# Patient Record
Sex: Male | Born: 1970 | Race: White | Hispanic: No | Smoking: Current every day smoker
Health system: Southern US, Community
[De-identification: ages and names within clinical notes are randomized; demographics above are authoritative.]

## PROBLEM LIST (undated history)

## (undated) HISTORY — PX: KNEE SURGERY: SHX244

## (undated) HISTORY — PX: APPENDECTOMY: SHX54

---

## 2003-06-07 ENCOUNTER — Ambulatory Visit (HOSPITAL_COMMUNITY): Admission: RE | Admit: 2003-06-07 | Discharge: 2003-06-07 | Payer: Self-pay | Admitting: Orthopedic Surgery

## 2003-07-18 ENCOUNTER — Encounter: Admission: RE | Admit: 2003-07-18 | Discharge: 2003-07-31 | Payer: Self-pay | Admitting: Orthopedic Surgery

## 2003-10-05 ENCOUNTER — Ambulatory Visit (HOSPITAL_COMMUNITY): Admission: RE | Admit: 2003-10-05 | Discharge: 2003-10-05 | Payer: Self-pay | Admitting: Orthopedic Surgery

## 2003-10-17 ENCOUNTER — Encounter: Admission: RE | Admit: 2003-10-17 | Discharge: 2003-11-03 | Payer: Self-pay | Admitting: Orthopedic Surgery

## 2004-03-13 ENCOUNTER — Encounter: Admission: RE | Admit: 2004-03-13 | Discharge: 2004-04-22 | Payer: Self-pay | Admitting: Orthopedic Surgery

## 2004-10-08 ENCOUNTER — Ambulatory Visit: Payer: Self-pay | Admitting: Physical Medicine & Rehabilitation

## 2004-10-08 ENCOUNTER — Encounter
Admission: RE | Admit: 2004-10-08 | Discharge: 2005-01-06 | Payer: Self-pay | Admitting: Physical Medicine & Rehabilitation

## 2005-01-17 ENCOUNTER — Encounter
Admission: RE | Admit: 2005-01-17 | Discharge: 2005-04-17 | Payer: Self-pay | Admitting: Physical Medicine & Rehabilitation

## 2005-09-09 ENCOUNTER — Encounter: Admission: RE | Admit: 2005-09-09 | Discharge: 2005-10-17 | Payer: Self-pay | Admitting: Orthopedic Surgery

## 2010-03-08 ENCOUNTER — Encounter
Admission: RE | Admit: 2010-03-08 | Discharge: 2010-03-08 | Payer: Self-pay | Source: Home / Self Care | Attending: Unknown Physician Specialty | Admitting: Unknown Physician Specialty

## 2010-04-06 ENCOUNTER — Encounter: Payer: Self-pay | Admitting: Internal Medicine

## 2010-08-02 NOTE — Procedures (Signed)
NAME:  BRENNAN, LITZINGER NO.:  [id number]   MEDICAL RECORD NO.:  [id number]          PATIENT TYPE:  REC   LOCATION:  [facility]                          FACILITY:  [facility]   PHYSICIAN:  Ranelle Oyster, M.D.DATE OF BIRTH:  09/28/1970   DATE OF PROCEDURE:  10/23/2004  DATE OF DISCHARGE:                                 OPERATIVE REPORT   PROCEDURE:  Synvisc injection to left knee, ICD-9 code 715.96.   DESCRIPTION OF PROCEDURE:  After informed consent and sterile preparation,  we located the injection site on the posterolateral to the anterior lateral  left knee.  The area was prepped with Betadine.  A 22-gauge, 1-1/2 inch  needle was injected into the intra-articular space.  Aspiration was utilized  and then 2 mL of Synvisc was injected into the joint.  The patient tolerated  this well.   The patient was given postinjection instructions.  He will continue with his  oxycodone for p.r.n. breakthrough pain.  He was given 90 oxycodone today.   Incidentally, the patient did well with the steroid for about three to four  dys, then pain returned.  Recommended continuing glucosamine/chondroitin.   I will see the patient back in approximately two weeks' time for the second  of three Synvisc injections.       ZTS/MEDQ  D:  10/23/2004 12:02:30  T:  10/23/2004 15:47:47  Job:  [id number]   cc:   Jonny Ruiz L. Rendall, M.D.  Fax: [phone]

## 2010-08-02 NOTE — Group Therapy Note (Signed)
CHIEF COMPLAINT:  Knee pain.   HISTORY OF PRESENT ILLNESS:  This is a 40 year old white male with a long  history of left knee pain.  He has seen Dr. Lacretia Nicks. Dava Najjar and Dr. Carlisle Beers.  Rendall most recently regarding the left knee problem.  He had a meniscal  repair performed by Dr. Madelon Lips remotely.  The knee has continued to bother  him.  Dr. Madelon Lips performed a steroid injection in July 2005, without  benefits.  He had exploratory surgery by Dr. 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  The pain has continued to be a  problem, however.  The patient had stopped working in March 2005, due to  pain and decreased exercise tolerance.  He had an MRI performed on Jul 23, 2004, which revealed a meniscectomy changes at the left knee, as well as a  patella [place] deformity with distal quads and proximal patella tendinosis and  reactive edema in the patella.  No tears were identified.  There is mild  subchondral edema noted anteriorly in the lateral femoral condyle.  This  appeared to be stress-related changes anteriorly in the patella at the  quadriceps insertion.  The patella cartilage was fairly preserved.  There  was low-level edema, now edema in the non-weightbearing portion of the  medial femoral condyle.  No cartilage deficits noted.  The patient has taken  medications including Percocet and Vicodin over the last few years to help  this break-through pain.  He has been using Vicodin most recently.  He had  therapy as recently as January, which seemed to help with his pain control,  but he has gotten away from that.  He is not regular with his home exercise  program to strengthen the leg.  The patient rates his pain as a 6/10 on  average.  It is described as sharp, dull, stabbing constant aching pain.  It  is generally at the left knee and radiates somewhat into the thigh when he  walks  longer distances, or when the pain is worse.  He has some right knee  pain which is secondary to his favoring the right leg when walking.  The  pain increases with walking, bending, sitting and general activities.  He  finds that steps are the worst and then standing and sitting for periods of  time.  The patient does not use an additive device.   PAST MEDICAL HISTORY:  1.  Positive for an appendectomy in 1978.  2.  Hernia repair in 1980.  3.  Right knee surgery in 1991.  4.  Exploratory knee surgery in 2005.  5.  Index finger surgery in 1983.   CURRENT MEDICATIONS:  Hydrocodone 5/500 mg q.i.d. as needed.   ALLERGIES:  No known drug allergies.   SOCIAL HISTORY:  The patient is married with five children.  He has a remote  alcohol history.  He smokes 1-1/2 packs of cigarettes per day.   FAMILY HISTORY:  Positive for lung disease, diabetes, hypertension, alcohol  abuse, gastric problems, drug abuse, disability and cancer.   PHYSICAL EXAMINATION:  VITAL SIGNS:  Blood pressure 134/85, pulse 100,  respirations 16, saturation 96% on room air.  GENERAL:  The patient is pleasant and in no acute distress.  HEART:  A regular rate and rhythm.  LUNGS:  Clear.  ABDOMEN:  Soft, nontender.  NEUROLOGIC/EXTREMITIES:  He is alert and oriented  x3.  Affect is bright and  appropriate.  Gait is slightly antalgic, favoring the left leg.  The patient  has some external rotation of the left leg.  This is minimal.  Measured no  leg length discrepancies from one side to the other.  The patient had  weakness today with extension of the left quadriceps, which I rated at 3+/5.  The remainder of his lower extremity motor exam was 5/5.  Upper extremity  motor exam was 5/5.  Sensory exam was normal in both upper and lower  extremities today.  Reflexes appear to be 2+.  Cognitively the patient was  normal.  He had a normal cranial nerve exam.   On examination of the left knee in detail, the patient had some  lateral  tracking of the patella, particularly with flexion.  The Q-angle was  approximately 10-12 degrees.  He had some wasting of the medial quadriceps,  particularly at the vastus medialis.  The patient had some pain with  meniscal maneuvers.  There was pain with resisted extension.  No frank knee  instability was seen in the ACL's or the PCL's.  The skin was the  appropriate color and temperature.  He was not hyper-sensitive to touch.  The distal lower extremity did not appear atrophic.   ASSESSMENT:  1.  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      He has had secondary wasting of the medial quadriceps, including the      vastus medialis.  2.  A patient with a history of a meniscal tear.   PLAN:  1.  This is generally a difficult case from the standpoint of pain etiology.      He does not have reflex sympathetic dystrophy.  There are no signs to      support that today.  His pain seems to be relegated to the left knee      itself and some of the muscle wasting is secondary to misuse.  He has      done some therapy in the past.  We discussed the home exercise program      to work on some vastus medialis exercises.  Consider getting him back      into therapy once again to work on some specific treatment modalities,      including electric stimulation, patellar taping, etc.  He has worn some      knee braces in the past, which we will stay away from at this point.  2.  Will set him up for Synvisc injections, to see if he has any relief with      this modality.  He has not had these before.  3.  In the interim today, after an informed consent, we injected the knee      via the posterolateral approach with 3 mL of 1% lidocaine and 40 mg of      Kenalog.  We will observe for any acute benefit from this injection      today.  4.  I provided him oxycodone for pain at this point, which he may take one     q.6h. p.r.n.  5.  We will follow up with him in  approximately two weeks for his Synvisc      injection, and to determine further treatment planning.   I would like to thank Dr. Jonny Ruiz L. Rendall for this referral.  Hopefully we  can be of some help to Mr. Hart Rochester.  ZTS/MedQ  D:  10/09/2004 12:36:53  T:  10/09/2004 14:00:45  Job #:  [id number]   cc:   Jonny Ruiz L. Rendall, M.D.  Fax: [phone]

## 2013-07-15 ENCOUNTER — Ambulatory Visit: Payer: Self-pay | Admitting: Family Medicine

## 2013-07-15 ENCOUNTER — Telehealth: Payer: Self-pay | Admitting: Family Medicine

## 2013-07-15 NOTE — Telephone Encounter (Signed)
appt made

## 2014-02-06 ENCOUNTER — Emergency Department (HOSPITAL_COMMUNITY): Payer: No Typology Code available for payment source

## 2014-02-06 ENCOUNTER — Emergency Department (HOSPITAL_COMMUNITY)
Admission: EM | Admit: 2014-02-06 | Discharge: 2014-02-06 | Disposition: A | Discharge status: Home / Self Care | Payer: No Typology Code available for payment source | Attending: Emergency Medicine | Admitting: Emergency Medicine

## 2014-02-06 ENCOUNTER — Encounter (HOSPITAL_COMMUNITY): Payer: Self-pay | Admitting: Emergency Medicine

## 2014-02-06 DIAGNOSIS — T07 Unspecified multiple injuries: Secondary | ICD-10-CM | POA: Insufficient documentation

## 2014-02-06 DIAGNOSIS — Y92410 Unspecified street and highway as the place of occurrence of the external cause: Secondary | ICD-10-CM | POA: Insufficient documentation

## 2014-02-06 DIAGNOSIS — Y998 Other external cause status: Secondary | ICD-10-CM | POA: Diagnosis not present

## 2014-02-06 DIAGNOSIS — S4991XA Unspecified injury of right shoulder and upper arm, initial encounter: Secondary | ICD-10-CM | POA: Diagnosis present

## 2014-02-06 DIAGNOSIS — Y9389 Activity, other specified: Secondary | ICD-10-CM | POA: Diagnosis not present

## 2014-02-06 DIAGNOSIS — T07XXXA Unspecified multiple injuries, initial encounter: Secondary | ICD-10-CM

## 2014-02-06 LAB — COMPREHENSIVE METABOLIC PANEL
ALT: 27 U/L (ref 0–53)
ANION GAP: 12 (ref 5–15)
AST: 30 U/L (ref 0–37)
Albumin: 4.3 g/dL (ref 3.5–5.2)
Alkaline Phosphatase: 139 U/L — ABNORMAL HIGH (ref 39–117)
BUN: 13 mg/dL (ref 6–23)
CO2: 28 mEq/L (ref 19–32)
CREATININE: 0.81 mg/dL (ref 0.50–1.35)
Calcium: 9 mg/dL (ref 8.4–10.5)
Chloride: 102 mEq/L (ref 96–112)
GFR calc non Af Amer: >90 mL/min (ref >90)
GLUCOSE: 74 mg/dL (ref 70–99)
Potassium: 3.6 mEq/L — ABNORMAL LOW (ref 3.7–5.3)
SODIUM: 142 meq/L (ref 137–147)
TOTAL PROTEIN: 7.7 g/dL (ref 6.0–8.3)
Total Bilirubin: 0.3 mg/dL (ref 0.3–1.2)

## 2014-02-06 LAB — CBC WITH DIFFERENTIAL/PLATELET
BASOS ABS: 0 10*3/uL (ref 0.0–0.1)
BASOS PCT: 0 % (ref 0–1)
EOS ABS: 1 10*3/uL — AB (ref 0.0–0.7)
EOS PCT: 10 % — AB (ref 0–5)
HEMATOCRIT: 51.3 % (ref 39.0–52.0)
Hemoglobin: 17.7 g/dL — ABNORMAL HIGH (ref 13.0–17.0)
Lymphocytes Relative: 33 % (ref 12–46)
Lymphs Abs: 3.1 10*3/uL (ref 0.7–4.0)
MCH: 30.9 pg (ref 26.0–34.0)
MCHC: 34.5 g/dL (ref 30.0–36.0)
MCV: 89.7 fL (ref 78.0–100.0)
MONO ABS: 0.8 10*3/uL (ref 0.1–1.0)
Monocytes Relative: 9 % (ref 3–12)
Neutro Abs: 4.5 10*3/uL (ref 1.7–7.7)
Neutrophils Relative %: 48 % (ref 43–77)
Platelets: 165 10*3/uL (ref 150–400)
RBC: 5.72 MIL/uL (ref 4.22–5.81)
RDW: 13.1 % (ref 11.5–15.5)
WBC: 9.4 10*3/uL (ref 4.0–10.5)

## 2014-02-06 LAB — URINALYSIS, ROUTINE W REFLEX MICROSCOPIC
Bilirubin Urine: NEGATIVE
Glucose, UA: NEGATIVE mg/dL
KETONES UR: NEGATIVE mg/dL
LEUKOCYTES UA: NEGATIVE
Nitrite: NEGATIVE
PROTEIN: NEGATIVE mg/dL
Specific Gravity, Urine: 1.01 (ref 1.005–1.030)
UROBILINOGEN UA: 0.2 mg/dL (ref 0.0–1.0)
pH: 5.5 (ref 5.0–8.0)

## 2014-02-06 LAB — URINE MICROSCOPIC-ADD ON

## 2014-02-06 LAB — ETHANOL: ALCOHOL ETHYL (B): 99 mg/dL — AB (ref 0–11)

## 2014-02-06 MED ORDER — IOHEXOL 300 MG/ML  SOLN
100.0000 mL | Freq: Once | INTRAMUSCULAR | Status: AC | PRN
  Administered 2014-02-06: 100 mL via INTRAVENOUS

## 2014-02-06 MED ORDER — IBUPROFEN 800 MG PO TABS: 800.0000 mg | ORAL_TABLET | Freq: Three times a day (TID) | ORAL | Status: DC

## 2014-02-06 NOTE — ED Notes (Addendum)
Pt. Denies hitting head. Pt. Denies loss of consciousness. Pt. Alert and oriented.
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
## 2014-02-06 NOTE — Discharge Instructions (Signed)
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
## 2014-02-06 NOTE — ED Notes (Signed)
Pt walking with tech and drinking coke

## 2014-02-06 NOTE — ED Notes (Signed)
Ambulated patient around the nurses station. No issues

## 2014-02-06 NOTE — ED Notes (Signed)
Gave patient a Coke as per the doctor. Patient tolerated well

## 2014-10-30 DIAGNOSIS — K088 Other specified disorders of teeth and supporting structures: Secondary | ICD-10-CM | POA: Diagnosis not present

## 2014-11-11 ENCOUNTER — Encounter (HOSPITAL_COMMUNITY): Payer: Self-pay | Admitting: Emergency Medicine

## 2014-11-11 ENCOUNTER — Emergency Department (HOSPITAL_COMMUNITY)
Admission: EM | Admit: 2014-11-11 | Discharge: 2014-11-11 | Disposition: A | Discharge status: Home / Self Care | Payer: Medicare Other | Attending: Emergency Medicine | Admitting: Emergency Medicine

## 2014-11-11 DIAGNOSIS — Z207 Contact with and (suspected) exposure to pediculosis, acariasis and other infestations: Secondary | ICD-10-CM | POA: Diagnosis not present

## 2014-11-11 DIAGNOSIS — Z791 Long term (current) use of non-steroidal anti-inflammatories (NSAID): Secondary | ICD-10-CM | POA: Diagnosis not present

## 2014-11-11 DIAGNOSIS — Z720 Tobacco use: Secondary | ICD-10-CM | POA: Diagnosis not present

## 2014-11-11 DIAGNOSIS — B86 Scabies: Secondary | ICD-10-CM | POA: Diagnosis not present

## 2014-11-11 DIAGNOSIS — Z4800 Encounter for change or removal of nonsurgical wound dressing: Secondary | ICD-10-CM | POA: Diagnosis present

## 2014-11-11 DIAGNOSIS — L089 Local infection of the skin and subcutaneous tissue, unspecified: Secondary | ICD-10-CM | POA: Diagnosis not present

## 2014-11-11 DIAGNOSIS — Z2089 Contact with and (suspected) exposure to other communicable diseases: Secondary | ICD-10-CM

## 2014-11-11 LAB — CBC WITH DIFFERENTIAL/PLATELET
BASOS PCT: 0 % (ref 0–1)
Basophils Absolute: 0 10*3/uL (ref 0.0–0.1)
EOS ABS: 0.8 10*3/uL — AB (ref 0.0–0.7)
EOS PCT: 10 % — AB (ref 0–5)
HCT: 47.5 % (ref 39.0–52.0)
HEMOGLOBIN: 16.5 g/dL (ref 13.0–17.0)
Lymphocytes Relative: 25 % (ref 12–46)
Lymphs Abs: 2.1 10*3/uL (ref 0.7–4.0)
MCH: 31.9 pg (ref 26.0–34.0)
MCHC: 34.7 g/dL (ref 30.0–36.0)
MCV: 91.7 fL (ref 78.0–100.0)
MONOS PCT: 9 % (ref 3–12)
Monocytes Absolute: 0.8 10*3/uL (ref 0.1–1.0)
NEUTROS PCT: 56 % (ref 43–77)
Neutro Abs: 4.6 10*3/uL (ref 1.7–7.7)
PLATELETS: 164 10*3/uL (ref 150–400)
RBC: 5.18 MIL/uL (ref 4.22–5.81)
RDW: 13 % (ref 11.5–15.5)
WBC: 8.2 10*3/uL (ref 4.0–10.5)

## 2014-11-11 MED ORDER — MUPIROCIN CALCIUM 2 % EX CREA
1.0000 | TOPICAL_CREAM | Freq: Two times a day (BID) | CUTANEOUS | Status: DC
Start: 2014-11-11 — End: 2018-05-25

## 2014-11-11 MED ORDER — CEPHALEXIN 500 MG PO CAPS: 500.0000 mg | ORAL_CAPSULE | Freq: Four times a day (QID) | ORAL | Status: DC

## 2014-11-11 MED ORDER — PERMETHRIN 5 % EX CREA: TOPICAL_CREAM | CUTANEOUS | Status: AC

## 2014-11-11 NOTE — ED Notes (Signed)
Per patient has multiple scabbed areas over entire body. Per patient painful. Denies any itching. Patient reports body aches, sore throat, "hot sweats," and swelling in knees bilaterally. Denies any known tick bites. Per patient x1 month and progressively getting worse. Per mother they have recently being treated for scabies in which she is getting better.

## 2014-11-11 NOTE — ED Notes (Signed)
IDOL- PA talking with patient at this time.

## 2014-11-11 NOTE — Discharge Instructions (Signed)
Rash A rash is a change in the color or texture of your skin. There are many different types of rashes. You may have other problems that accompany your rash. CAUSES   Infections.  Allergic reactions. This can include allergies to pets or foods.  Certain medicines.  Exposure to certain chemicals, soaps, or cosmetics.  Heat.  Exposure to poisonous plants.  Tumors, both cancerous and noncancerous. SYMPTOMS   Redness.  Scaly skin.  Itchy skin.  Dry or cracked skin.  Bumps.  Blisters.  Pain. DIAGNOSIS  Your caregiver may do a physical exam to determine what type of rash you have. A skin sample (biopsy) may be taken and examined under a microscope. TREATMENT  Treatment depends on the type of rash you have. Your caregiver may prescribe certain medicines. For serious conditions, you may need to see a skin doctor (dermatologist). HOME CARE INSTRUCTIONS   Avoid the substance that caused your rash.  Do not scratch your rash. This can cause infection.  You may take cool baths to help stop itching.  Only take over-the-counter or prescription medicines as directed by your caregiver.  Keep all follow-up appointments as directed by your caregiver. SEEK IMMEDIATE MEDICAL CARE IF:  You have increasing pain, swelling, or redness.  You have a fever.  You have new or severe symptoms.  You have body aches, diarrhea, or vomiting.  Your rash is not better after 3 days. MAKE SURE YOU:  Understand these instructions.  Will watch your condition.  Will get help right away if you are not doing well or get worse. Document Released: 02/21/2002 Document Revised: 05/26/2011 Document Reviewed: 12/16/2010 [facility] Patient Information 2015 [place], [place]. This information is not intended to replace advice given to you by your health care provider. Make sure you discuss any questions you have with your health care provider.  You are being treated for scabies since you have had this  recent exposure. However some of your wounds appear infected, especially on your face.  Avoid rubbing, scratching or squeezing any rash sites.  Take the entire course of the antibiotic capsules, plus you may use the mupirocin cream on the larger areas on your face.  See your doctor for a recheck if not improving with this treatment.

## 2014-11-11 NOTE — ED Notes (Signed)
PA at bedside.

## 2014-11-12 LAB — RPR: RPR Ser Ql: NONREACTIVE

## 2014-11-12 NOTE — ED Provider Notes (Signed)
CSN: [id number]     Arrival date & time 11/11/14  4098 History   First MD Initiated Contact with Patient 11/11/14 [phone]     Chief Complaint  Patient presents with  . Wound Check     (Consider location/radiation/quality/duration/timing/severity/associated sxs/prior Treatment) The history is provided by the patient and a parent.   Dennis Tyler is a 44 y.o. male presenting with a painful rash which started approximately one month ago, starting on his legs, but has spread, now involving his arms and now his face.  He reports feeling generally fatigued, denies fevers, but has occasionally feels "hot sweats" and also notes bilateral knee pain, but also endorses he ran 5 miles yesterday which he has not done in awhile.  He lives with his girlfriend predominantly whose dogs are "covered in fleas" but pt states she does not have a similar rash.  He has also been staying at his parents home some, who both had a similar rash and were treated for scabies 2 weeks ago with improved symptoms.  Patients rash on his arms and legs start out itchy, but become painful after scratching.  He has developed facial lesions now which are painful, with a few of them draining clear yellow discharge.  He has found no alleviators.  He does have chronic back pain and knee pain, has taken his home oxycodone with little relief.    History reviewed. No pertinent past medical history. Past Surgical History  Procedure Laterality Date  . Appendectomy    . Knee surgery     Family History  Problem Relation Age of Onset  . Cancer Other   . Stroke Other    Social History  Substance Use Topics  . Smoking status: Current Every Day Smoker -- 1.00 packs/day for 20 years    Types: Cigarettes  . Smokeless tobacco: Never Used  . Alcohol Use: 50.4 oz/week    84 Cans of beer per week    Review of Systems  Constitutional: Negative for fever and chills.  Respiratory: Negative for shortness of breath and wheezing.    Musculoskeletal: Positive for joint swelling and arthralgias.  Skin: Positive for rash.  Neurological: Negative for numbness.      Allergies  Review of patient's allergies indicates no known allergies.  Home Medications   Prior to Admission medications   Medication Sig Start Date End Date Taking? Authorizing Provider  HYDROcodone-acetaminophen (NORCO) 10-325 MG per tablet Take 1 tablet by mouth every 6 (six) hours as needed.   Yes Historical Provider, MD  ibuprofen (ADVIL,MOTRIN) 800 MG tablet Take 1 tablet (800 mg total) by mouth 3 (three) times daily. 02/06/14  Yes Glynn Octave, MD  Oxycodone HCl 20 MG TABS Take 1 tablet by mouth every 12 (twelve) hours.   Yes Historical Provider, MD  cephALEXin (KEFLEX) 500 MG capsule Take 1 capsule (500 mg total) by mouth 4 (four) times daily. 11/11/14   Burgess Amor, PA-C  mupirocin cream (BACTROBAN) 2 % Apply 1 application topically 2 (two) times daily. 11/11/14   Burgess Amor, PA-C  permethrin (ELIMITE) 5 % cream Apply to affected area once, leaving on for 10 hours, then shower. 11/11/14   Burgess Amor, PA-C   BP 139/98 mmHg  Pulse 93  Temp(Src) 98 F (36.7 C) (Oral)  Resp 16  Ht 5\' 11"  (1.803 m)  Wt 185 lb (83.915 kg)  BMI 25.81 kg/m2  SpO2 98% Physical Exam  Constitutional: He appears well-developed and well-nourished. No distress.  HENT:  Head: Normocephalic.  Mouth/Throat: Uvula is midline and oropharynx is clear and moist. No oropharyngeal exudate or posterior oropharyngeal erythema.  Neck: Neck supple.  Cardiovascular: Normal rate.   Pulmonary/Chest: Effort normal. He has no wheezes.  Musculoskeletal: Normal range of motion. He exhibits no edema.  Skin: Rash noted. Rash is papular.  Scattered papular rash on legs and distal forearms. Discrete red lesions, approx 2 mm to 5 mm in diameter. Centers appear excoriated.  Macular scabs noted bilateral cheeks and chin area, largest is 1.5 cm, moist appearing.  Mild surrounding erythema.   Scattered small scabs on nasal ala bilaterally.    ED Course  Procedures (including critical care time) Labs Review Labs Reviewed  CBC WITH DIFFERENTIAL/PLATELET - Abnormal; Notable for the following:    Eosinophils Relative 10 (*)    Eosinophils Absolute 0.8 (*)    All other components within normal limits  RPR    Imaging Review No results found. I have personally reviewed and evaluated these images and lab results as part of my medical decision-making.   EKG Interpretation None      MDM   Final diagnoses:  Scabies exposure  Skin infection    Since pt has been exposed to scabies, will tx for this condition with elimite.  Facial wounds concerning for infection, with clear yellow drainage per hx, possible impetigo.  He was prescribed keflex oral, mupirocin cream for the facial lesions.  He was advised f/u with his pcp if sx persist or worsen despite tx.  Labs reviewed, normal wbc with eosinophilic shift suggesting scabies as source of rash.   Burgess Amor, PA-C 11/12/14 1610  Zadie Rhine, MD 11/12/14 [phone]

## 2015-04-29 DIAGNOSIS — M7989 Other specified soft tissue disorders: Secondary | ICD-10-CM | POA: Diagnosis not present

## 2015-04-29 DIAGNOSIS — S6991XA Unspecified injury of right wrist, hand and finger(s), initial encounter: Secondary | ICD-10-CM | POA: Diagnosis not present

## 2015-04-29 DIAGNOSIS — Y9389 Activity, other specified: Secondary | ICD-10-CM | POA: Diagnosis not present

## 2015-04-29 DIAGNOSIS — M79641 Pain in right hand: Secondary | ICD-10-CM | POA: Diagnosis not present

## 2015-04-29 DIAGNOSIS — Y92099 Unspecified place in other non-institutional residence as the place of occurrence of the external cause: Secondary | ICD-10-CM | POA: Diagnosis not present

## 2015-04-29 DIAGNOSIS — S61431A Puncture wound without foreign body of right hand, initial encounter: Secondary | ICD-10-CM | POA: Diagnosis not present

## 2015-04-29 DIAGNOSIS — W228XXA Striking against or struck by other objects, initial encounter: Secondary | ICD-10-CM | POA: Diagnosis not present

## 2015-04-29 DIAGNOSIS — S62332A Displaced fracture of neck of third metacarpal bone, right hand, initial encounter for closed fracture: Secondary | ICD-10-CM | POA: Diagnosis not present

## 2015-05-07 DIAGNOSIS — M79641 Pain in right hand: Secondary | ICD-10-CM | POA: Diagnosis not present

## 2015-09-14 DIAGNOSIS — Z9049 Acquired absence of other specified parts of digestive tract: Secondary | ICD-10-CM | POA: Diagnosis not present

## 2015-09-14 DIAGNOSIS — R740 Nonspecific elevation of levels of transaminase and lactic acid dehydrogenase [LDH]: Secondary | ICD-10-CM | POA: Diagnosis not present

## 2015-09-14 DIAGNOSIS — S41141A Puncture wound with foreign body of right upper arm, initial encounter: Secondary | ICD-10-CM | POA: Diagnosis not present

## 2015-09-14 DIAGNOSIS — S41121A Laceration with foreign body of right upper arm, initial encounter: Secondary | ICD-10-CM | POA: Diagnosis not present

## 2015-09-14 DIAGNOSIS — S41111A Laceration without foreign body of right upper arm, initial encounter: Secondary | ICD-10-CM | POA: Diagnosis not present

## 2015-09-14 DIAGNOSIS — T148 Other injury of unspecified body region: Secondary | ICD-10-CM | POA: Diagnosis not present

## 2015-09-14 DIAGNOSIS — F17210 Nicotine dependence, cigarettes, uncomplicated: Secondary | ICD-10-CM | POA: Diagnosis not present

## 2015-09-14 DIAGNOSIS — F1020 Alcohol dependence, uncomplicated: Secondary | ICD-10-CM | POA: Diagnosis not present

## 2015-09-14 DIAGNOSIS — S299XXA Unspecified injury of thorax, initial encounter: Secondary | ICD-10-CM | POA: Diagnosis not present

## 2015-09-20 ENCOUNTER — Encounter (HOSPITAL_COMMUNITY): Payer: Self-pay

## 2015-09-20 ENCOUNTER — Emergency Department (HOSPITAL_COMMUNITY)
Admission: EM | Admit: 2015-09-20 | Discharge: 2015-09-20 | Discharge status: Against Medical Advice | Payer: Medicare Other | Attending: Emergency Medicine | Admitting: Emergency Medicine

## 2015-09-20 DIAGNOSIS — Z791 Long term (current) use of non-steroidal anti-inflammatories (NSAID): Secondary | ICD-10-CM | POA: Insufficient documentation

## 2015-09-20 DIAGNOSIS — Z79899 Other long term (current) drug therapy: Secondary | ICD-10-CM | POA: Diagnosis not present

## 2015-09-20 DIAGNOSIS — F17210 Nicotine dependence, cigarettes, uncomplicated: Secondary | ICD-10-CM | POA: Insufficient documentation

## 2015-09-20 DIAGNOSIS — R451 Restlessness and agitation: Secondary | ICD-10-CM
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
## 2015-09-20 NOTE — ED Notes (Signed)
Pt approched desk and states he has to go outside and smoke.  Ask pt to please to stay in room d/t when the dr comes in he will be in the room.  Pt walked out the door anyway.

## 2015-09-20 NOTE — ED Notes (Signed)
Pt approached desk, ripped off id band and states "if yall don't think this is any more important than this!!" pt then took off walking toward the waiting room.  ED MD notified
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
## 2015-11-07 DIAGNOSIS — S4991XA Unspecified injury of right shoulder and upper arm, initial encounter: Secondary | ICD-10-CM | POA: Diagnosis not present

## 2015-11-07 DIAGNOSIS — S46211A Strain of muscle, fascia and tendon of other parts of biceps, right arm, initial encounter: Secondary | ICD-10-CM | POA: Diagnosis not present

## 2015-11-07 DIAGNOSIS — S46811A Strain of other muscles, fascia and tendons at shoulder and upper arm level, right arm, initial encounter: Secondary | ICD-10-CM | POA: Diagnosis not present

## 2015-11-07 DIAGNOSIS — M7989 Other specified soft tissue disorders: Secondary | ICD-10-CM | POA: Diagnosis not present

## 2015-11-07 DIAGNOSIS — Z9049 Acquired absence of other specified parts of digestive tract: Secondary | ICD-10-CM | POA: Diagnosis not present

## 2015-11-08 DIAGNOSIS — S46211A Strain of muscle, fascia and tendon of other parts of biceps, right arm, initial encounter: Secondary | ICD-10-CM | POA: Diagnosis not present

## 2015-11-13 DIAGNOSIS — S46211A Strain of muscle, fascia and tendon of other parts of biceps, right arm, initial encounter: Secondary | ICD-10-CM | POA: Diagnosis not present

## 2015-11-21 DIAGNOSIS — Z9089 Acquired absence of other organs: Secondary | ICD-10-CM | POA: Diagnosis not present

## 2015-11-21 DIAGNOSIS — I10 Essential (primary) hypertension: Secondary | ICD-10-CM | POA: Diagnosis not present

## 2015-11-21 DIAGNOSIS — S46211A Strain of muscle, fascia and tendon of other parts of biceps, right arm, initial encounter: Secondary | ICD-10-CM | POA: Diagnosis not present

## 2015-12-03 DIAGNOSIS — Z9889 Other specified postprocedural states: Secondary | ICD-10-CM | POA: Diagnosis not present

## 2015-12-03 DIAGNOSIS — S46111D Strain of muscle, fascia and tendon of long head of biceps, right arm, subsequent encounter: Secondary | ICD-10-CM | POA: Diagnosis not present

## 2016-05-01 DIAGNOSIS — K068 Other specified disorders of gingiva and edentulous alveolar ridge: Secondary | ICD-10-CM | POA: Diagnosis not present

## 2018-05-25 ENCOUNTER — Other Ambulatory Visit: Payer: Self-pay

## 2018-05-25 ENCOUNTER — Encounter (HOSPITAL_COMMUNITY): Payer: Self-pay | Admitting: Emergency Medicine

## 2018-05-25 ENCOUNTER — Emergency Department (HOSPITAL_COMMUNITY)
Admission: EM | Admit: 2018-05-25 | Discharge: 2018-05-25 | Disposition: A | Discharge status: Home / Self Care | Payer: Medicare Other | Attending: Emergency Medicine | Admitting: Emergency Medicine

## 2018-05-25 DIAGNOSIS — Z79899 Other long term (current) drug therapy: Secondary | ICD-10-CM | POA: Insufficient documentation

## 2018-05-25 DIAGNOSIS — R945 Abnormal results of liver function studies: Secondary | ICD-10-CM | POA: Insufficient documentation

## 2018-05-25 DIAGNOSIS — F1910 Other psychoactive substance abuse, uncomplicated: Secondary | ICD-10-CM

## 2018-05-25 DIAGNOSIS — R7989 Other specified abnormal findings of blood chemistry: Secondary | ICD-10-CM

## 2018-05-25 DIAGNOSIS — F17210 Nicotine dependence, cigarettes, uncomplicated: Secondary | ICD-10-CM | POA: Insufficient documentation

## 2018-05-25 DIAGNOSIS — R339 Retention of urine, unspecified: Secondary | ICD-10-CM | POA: Insufficient documentation

## 2018-05-25 LAB — RAPID URINE DRUG SCREEN, HOSP PERFORMED
Amphetamines: POSITIVE — AB
Barbiturates: NOT DETECTED
Benzodiazepines: NOT DETECTED
Cocaine: NOT DETECTED
Opiates: NOT DETECTED
TETRAHYDROCANNABINOL: NOT DETECTED

## 2018-05-25 LAB — URINALYSIS, ROUTINE W REFLEX MICROSCOPIC
Bacteria, UA: NONE SEEN
Bilirubin Urine: NEGATIVE
GLUCOSE, UA: 50 mg/dL — AB
KETONES UR: 20 mg/dL — AB
LEUKOCYTE UA: NEGATIVE
Nitrite: NEGATIVE
PH: 5 (ref 5.0–8.0)
Protein, ur: 30 mg/dL — AB
SPECIFIC GRAVITY, URINE: 1.023 (ref 1.005–1.030)

## 2018-05-25 LAB — CBC WITH DIFFERENTIAL/PLATELET
Abs Immature Granulocytes: 0.05 10*3/uL (ref 0.00–0.07)
Basophils Absolute: 0.1 10*3/uL (ref 0.0–0.1)
Basophils Relative: 0 %
Eosinophils Absolute: 0.1 10*3/uL (ref 0.0–0.5)
Eosinophils Relative: 0 %
HCT: 47.5 % (ref 39.0–52.0)
Hemoglobin: 16.2 g/dL (ref 13.0–17.0)
Immature Granulocytes: 0 %
Lymphocytes Relative: 9 %
Lymphs Abs: 1.3 10*3/uL (ref 0.7–4.0)
MCH: 32.2 pg (ref 26.0–34.0)
MCHC: 34.1 g/dL (ref 30.0–36.0)
MCV: 94.4 fL (ref 80.0–100.0)
Monocytes Absolute: 1.4 10*3/uL — ABNORMAL HIGH (ref 0.1–1.0)
Monocytes Relative: 9 %
Neutro Abs: 12.6 10*3/uL — ABNORMAL HIGH (ref 1.7–7.7)
Neutrophils Relative %: 82 %
Platelets: 244 10*3/uL (ref 150–400)
RBC: 5.03 MIL/uL (ref 4.22–5.81)
RDW: 13.1 % (ref 11.5–15.5)
WBC: 15.5 10*3/uL — ABNORMAL HIGH (ref 4.0–10.5)
nRBC: 0 % (ref 0.0–0.2)

## 2018-05-25 LAB — COMPREHENSIVE METABOLIC PANEL
ALT: 164 U/L — AB (ref 0–44)
AST: 427 U/L — ABNORMAL HIGH (ref 15–41)
Albumin: 3.8 g/dL (ref 3.5–5.0)
Alkaline Phosphatase: 85 U/L (ref 38–126)
Anion gap: 9 (ref 5–15)
BUN: 31 mg/dL — ABNORMAL HIGH (ref 6–20)
CO2: 30 mmol/L (ref 22–32)
CREATININE: 0.95 mg/dL (ref 0.61–1.24)
Calcium: 8.6 mg/dL — ABNORMAL LOW (ref 8.9–10.3)
Chloride: 91 mmol/L — ABNORMAL LOW (ref 98–111)
GFR calc non Af Amer: >60 mL/min (ref >60)
Glucose, Bld: 187 mg/dL — ABNORMAL HIGH (ref 70–99)
POTASSIUM: 3.9 mmol/L (ref 3.5–5.1)
Sodium: 130 mmol/L — ABNORMAL LOW (ref 135–145)
Total Bilirubin: 1.2 mg/dL (ref 0.3–1.2)
Total Protein: 7.2 g/dL (ref 6.5–8.1)

## 2018-05-25 LAB — ACETAMINOPHEN LEVEL: Acetaminophen (Tylenol), Serum: <10 ug/mL — ABNORMAL LOW (ref 10–30)

## 2018-05-25 LAB — LIPASE, BLOOD: Lipase: 18 U/L (ref 11–51)

## 2018-05-25 LAB — ETHANOL

## 2018-05-25 LAB — SALICYLATE LEVEL: Salicylate Lvl: <7 mg/dL (ref 2.8–30.0)

## 2018-05-25 LAB — LACTIC ACID, PLASMA
Lactic Acid, Venous: 1.7 mmol/L (ref 0.5–1.9)
Lactic Acid, Venous: 1.6 mmol/L (ref 0.5–1.9)

## 2018-05-25 LAB — TSH: TSH: 0.407 u[IU]/mL (ref 0.350–4.500)

## 2018-05-25 LAB — AMMONIA: Ammonia: 19 umol/L (ref 9–35)

## 2018-05-25 MED ORDER — SODIUM CHLORIDE 0.9 % IV BOLUS: 1000.0000 mL | Freq: Once | INTRAVENOUS | Status: DC

## 2018-05-25 MED ORDER — THIAMINE HCL 100 MG/ML IJ SOLN: 100.0000 mg | Freq: Once | INTRAMUSCULAR | Status: DC

## 2018-05-25 NOTE — ED Provider Notes (Signed)
Emergency Department Provider Note   I have reviewed the triage vital signs and the nursing notes.   HISTORY  Chief Complaint Drug Overdose (possible on Ritalin)   HPI Dennis Tyler is a 48 y.o. male with PMH of EtOH abuse presents to the emergency department with concern for altered mental status, urine retention, Ritalin abuse.  Family state that the patient was prescribed 180 tablets of Ritalin from a new PCP.  I spoke to the patient's ex-girlfriend, Dennis Tyler, who tells me that she was with him at the appointment when the medication was prescribed and they filled it.  She states that he left her house with the medication after she attempted to hide it.  She has seen him occasionally throughout the weekend and states that he seems intoxicated and high on Ritalin.  They found an empty bottle today.  The patient denies any suicidal or homicidal ideation.  He denies taking or ever being prescribed Ritalin.  Patient and family do endorse alcohol abuse and heavy use.  He denies using any other drugs or prescription medications.  Patient's girlfriend, by phone, tells me that he is known to associate with other addicts and so she is unsure if he is taking anything else.  Patient denies any fevers or chills. No CP or SOB.   History reviewed. No pertinent past medical history.  There are no active problems to display for this patient.   Past Surgical History:  Procedure Laterality Date  . APPENDECTOMY    . KNEE SURGERY     Allergies Patient has no known allergies.  Family History  Problem Relation Age of Onset  . Cancer Other   . Stroke Other     Social History Social History   Tobacco Use  . Smoking status: Current Every Day Smoker    Packs/day: 1.00    Years: 20.00    Pack years: 20.00    Types: Cigarettes  . Smokeless tobacco: Never Used  Substance Use Topics  . Alcohol use: Yes    Alcohol/week: 84.0 standard drinks    Types: 84 Cans of beer per week  . Drug use: Yes   Types: Marijuana    Review of Systems  Constitutional: No fever/chills Eyes: No visual changes. ENT: No sore throat. Cardiovascular: Denies chest pain. Respiratory: Denies shortness of breath. Gastrointestinal: No abdominal pain.  No nausea, no vomiting.  No diarrhea.  No constipation. Genitourinary: Negative for dysuria. Positive difficulty with urination.  Musculoskeletal: Negative for back pain. Skin: Negative for rash. Neurological: Negative for headaches, focal weakness or numbness.  10-point ROS otherwise negative.  ____________________________________________   PHYSICAL EXAM:  VITAL SIGNS: ED Triage Vitals  Enc Vitals Group     BP 05/25/18 1303 (!) 152/85     Pulse Rate 05/25/18 1303 89     Resp 05/25/18 1303 19     Temp 05/25/18 1303 (!) 97.5 F (36.4 C)     Temp Source 05/25/18 1303 Oral     SpO2 05/25/18 1303 98 %     Weight 05/25/18 1304 180 lb (81.6 kg)     Height 05/25/18 1304 5\' 9"  (1.753 m)     Pain Score 05/25/18 1306 5   Constitutional: Alert and oriented. Well appearing and in no acute distress. Eyes: Conjunctivae are normal. Pupils are 5 mm and sluggish.  Head: Atraumatic. Nose: No congestion/rhinnorhea. Mouth/Throat: Mucous membranes are moist.  Neck: No stridor.  Cardiovascular: Normal rate, regular rhythm. Good peripheral circulation. Grossly normal heart sounds.  Respiratory: Normal respiratory effort.  No retractions. Lungs CTAB. Gastrointestinal: Soft with mild suprapubic tenderness or fullness. No distention.  Musculoskeletal: No lower extremity tenderness nor edema. No gross deformities of extremities. Neurologic:  Normal speech and language. No gross focal neurologic deficits are appreciated.  Skin:  Skin is warm, dry and intact. No rash noted.  ____________________________________________   LABS (all labs ordered are listed, but only abnormal results are displayed)  Labs Reviewed  URINALYSIS, ROUTINE W REFLEX MICROSCOPIC -  Abnormal; Notable for the following components:      Result Value   APPearance HAZY (*)    Glucose, UA 50 (*)    Hgb urine dipstick MODERATE (*)    Ketones, ur 20 (*)    Protein, ur 30 (*)    All other components within normal limits  COMPREHENSIVE METABOLIC PANEL - Abnormal; Notable for the following components:   Sodium 130 (*)    Chloride 91 (*)    Glucose, Bld 187 (*)    BUN 31 (*)    Calcium 8.6 (*)    AST 427 (*)    ALT 164 (*)    All other components within normal limits  ACETAMINOPHEN LEVEL - Abnormal; Notable for the following components:   Acetaminophen (Tylenol), Serum <10 (*)    All other components within normal limits  CBC WITH DIFFERENTIAL/PLATELET - Abnormal; Notable for the following components:   WBC 15.5 (*)    Neutro Abs 12.6 (*)    Monocytes Absolute 1.4 (*)    All other components within normal limits  RAPID URINE DRUG SCREEN, HOSP PERFORMED - Abnormal; Notable for the following components:   Amphetamines POSITIVE (*)    All other components within normal limits  ETHANOL  SALICYLATE LEVEL  LACTIC ACID, PLASMA  LACTIC ACID, PLASMA  AMMONIA  LIPASE, BLOOD  TSH   ____________________________________________  EKG   EKG Interpretation  Date/Time:  Tuesday May 25 2018 13:27:56 EDT Ventricular Rate:  91 PR Interval:    QRS Duration: 102 QT Interval:  359 QTC Calculation: 442 R Axis:   74 Text Interpretation:  Sinus rhythm Nonspecific repol abnormality, inferior leads Baseline wander in lead(s) II III aVF No STEMI.  Confirmed by Alona Bene [id number] on 05/25/2018 1:55:29 PM      ____________________________________________   PROCEDURES  Procedure(s) performed:   Procedures  None  ____________________________________________   INITIAL IMPRESSION / ASSESSMENT AND PLAN / ED COURSE  Pertinent labs & imaging results that were available during my care of the patient were reviewed by me and considered in my medical decision making (see chart  for details).  Patient presents to the emergency department with likely Ritalin abuse.  He is having some urinary retention symptoms.  He has some tenderness in the suprapubic area and tells me "I have to pee."  When he tries to urinate he has only a small volume output.  Will obtain bladder scan in addition to blood and urine tox results.  Patient feels like he needs to pee now so we will allow him to try but discussed that if he needs to retain he will need a Foley catheter placed.  He has no focal neurologic deficits.    Patient labs reviewed.  His U tox is positive for amphetamines which correlates well with the Ritalin abuse.  Patient also suspected of using methamphetamine.  Remaining labs show elevated LFTs with normal bilirubin.  The patient denies abdominal pain and is afebrile.  He has no tenderness on abdominal exam.  Extremely low suspicion for cholangitis.  I provided contact information for local gastroenterology and encouraged the patient to both stop drinking alcohol and follow-up with GI.  Mom and son at bedside for discussion who will assist him with this.   Patient retaining over 1000 mL's of urine in the bladder.  Foley catheter placed without complication by nursing staff and Foley will be left in place.  Will discuss care instructions with patient and family.  He is to follow-up with urology for void trial in the office.  Discussed not removing this without medical assistance.  Also discussed emergency department return precautions.  Patient and family also provided with list of local resources for drug abuse treatment programs.  Mom will assist the patient with calling various facilities.  Patient reports feeling much improved after Foley placement and patient and family are comfortable with the plan at discharge. ____________________________________________  FINAL CLINICAL IMPRESSION(S) / ED DIAGNOSES  Final diagnoses:  Urinary retention  Polysubstance abuse (HCC)  Elevated  LFTs    MEDICATIONS GIVEN DURING THIS VISIT:  Medications  sodium chloride 0.9 % bolus 1,000 mL (has no administration in time range)  thiamine (B-1) injection 100 mg (has no administration in time range)   Note:  This document was prepared using Dragon voice recognition software and may include unintentional dictation errors.  Alona Bene, MD Emergency Medicine    Mckynzi Cammon, Arlyss Repress, MD 05/25/18 [phone]

## 2018-05-25 NOTE — ED Triage Notes (Signed)
C/o Urinary retention and urinary frequency.  C/o pain 5/10 to buttock.  Mother reports pt is having confusion.

## 2018-05-25 NOTE — Discharge Instructions (Signed)
You were seen in the ED today with inability to urinate. This is likely from your recent drug use. I have listed several outpatient facilities for drug treatment. Please call and get help today.   Your foley catheter will need to be removed in 1-2 weeks at the urology office. Call to schedule this appointment. Do not try to remove the catheter yourself as this can cause damage if pulled out wrong.   Your liver enzymes are also high. This is likely due to alcohol and you should stop as soon as you can. Call the Gastroenterologist listed to schedule an ASAP follow up appointment.  Return to the ED with any fever, chills, abdominal pain, chest pain, shortness or breath, yellowing of the skin/eyes or other concerning symptoms.   Substance Abuse Treatment Programs  Intensive Outpatient Programs [facility]     [address]      [place], [place]                   [phone]       [facility] [address] [place] [address] [place], [place] [phone]  [facility]     (Inpatient and outpatient)     [address]           [phone]    [facility] [phone] (Suboxone and Methadone)  [address]      [place], [place] [address]      [phone]       [address] [place], [place] [phone]  [facility] (Outpatient/Inpatient, Chemical)    (insurance only) [phone]             [facility] (Groups & Residential) [place], [place] [phone]     [facility]     [address]     [place], [place]      [phone]       [facility] (for caregivers and family) [phone] [address] [place], [place] [phone]      Residential Treatment Programs [facility]      [address] [place], [place] [address]  [phone]       [facility] [address] [place], [place] [address] [phone]  [facility] [place]         [phone]       [facility] [phone]  [facility]             [address]                                         [place], [place]                                                [phone] or [phone]                               [facility] of [place] [address] [place], [address] [phone]  [facility]    [address]      [place], [place]     [phone]       [facility] [employer] [phone]  [address] [place], [place] [phone]  [facility]   [address] [place], [place] [address]     [phone]      Admissions: 8am-3pm M-F  [facility] (RTS) [address] [place], [place] [phone]  [facility]: Residential Program [phone] Days)   [place], [place]      [phone] or [phone]     [facility]: [facility] [place], [place] (Walk in Hours over the weekend or by referral)  [facility] [address] [place], [place], [place] [address] [phone]  Crisis Mobile: [facility]:  [phone] (for crisis response 24 hours a day) [facility] Hotline:      [phone] Outpatient Psychiatry and Counseling  [facility]: Mobile Crisis Management 24 hours:  [phone]  [facility] of the [employer] sliding scale fee and walk in schedule: M-F 8am-12pm/1pm-3pm [address]  [place], [place] [address] [phone]  [facility] [address] [place], [place] [address] [phone]  [facility] (Formerly known as [facility] [employer])- new patient walk-in appointments available Monday - Friday 8am -3pm.          [address] [place], [place] [address] [phone] or crisis line- [phone]  [facility]/ Intensive Outpatient Therapy Program [address] [place], [place]  [address] [phone]  [facility]                  Crisis Services      [phone] [address]     [place], [place] [address]                 [facility]   [facility] [phone]. [address] [place], [place] [address]   [employer] of Care          [address]  [place], [place] [address]       [phone]  [facility] [address] [place], [place] [address] [phone]  [facility]    [address]    [place], [place] [address]     [phone]       Andee Poles, MD     [address]     [place] [address]     [phone]       [facility] [address] [place] [address]  [facility]     [address] [place], [place]      [phone]       [facility] Eulogio Ditch, MD [address] [place], [place] [address] [phone]  [facility]     [address]     [place], [place] [address]     [phone]       [facility] [address] [place], [place] [address] [phone] Resources for Temporary Residential Assistance/Crisis Centers  [facility]) M-F 8am-3pm   [address] [place], [place] [address]   [phone] Services include: laundry, barbering, support groups, case management, phone  & computer access, showers, AA/NA mtgs, mental health/substance abuse nurse, job skills class, disability information, VA assistance, spiritual classes, etc.   HOMELESS SHELTERS  [facility]     [facility]   [address], [place]  [place]              [employer] (women and children)       [address] [place], [place] [address] [phone] [email or web address] for application and process Application Required  [facility]   [address]    [place] [address]     [phone]                    [facility] [place] [address] [place], [place] [address] [phone] application appt.) Application Required  [facility] (women only)    [address]     [place], [place] [address]     [phone]      Intake starts 6pm daily Need valid ID, SSC, & Police report [employer] [address] [place], [place] [phone] Application Required  [employer] (men only)     [address]      [place], [place]     [phone]       [facility] (Pregnant women only) [address] [place], [place] [phone]  [facility]      [address]      [place], [place] [address]     [phone]             [facility] [address] [place], [place] [phone] 90 day commitment/SA/Application process  [facility](men only)     [address]     [place], [place]     [phone]       Check-in at [facility] [address] [place], [place] [address] [phone] Men/Women/Women and Children must be there by 7 pm  [facility] [place], [place] [phone]

## 2018-05-25 NOTE — ED Triage Notes (Signed)
Pt when to bathroom, reports that pts girlfriend reports that pt may be overdosed on 120 tabs Ritalin in one week.

## 2018-05-25 NOTE — ED Notes (Signed)
Unable to provide urine sample

## 2018-05-25 NOTE — ED Notes (Signed)
1000 ml+ after bladder scan.

## 2018-05-26 ENCOUNTER — Inpatient Hospital Stay (HOSPITAL_COMMUNITY)
Admission: EM | Admit: 2018-05-26 | Discharge: 2018-05-29 | DRG: 917 | Disposition: A | Discharge status: Home / Self Care | Payer: Medicare Other | Attending: Internal Medicine | Admitting: Internal Medicine

## 2018-05-26 ENCOUNTER — Encounter (HOSPITAL_COMMUNITY): Payer: Self-pay | Admitting: Emergency Medicine

## 2018-05-26 ENCOUNTER — Other Ambulatory Visit: Payer: Self-pay

## 2018-05-26 ENCOUNTER — Emergency Department (HOSPITAL_COMMUNITY): Payer: Medicare Other

## 2018-05-26 DIAGNOSIS — G9340 Encephalopathy, unspecified: Secondary | ICD-10-CM | POA: Diagnosis present

## 2018-05-26 DIAGNOSIS — E872 Acidosis, unspecified: Secondary | ICD-10-CM

## 2018-05-26 DIAGNOSIS — L8990 Pressure ulcer of unspecified site, unspecified stage: Secondary | ICD-10-CM

## 2018-05-26 DIAGNOSIS — F17210 Nicotine dependence, cigarettes, uncomplicated: Secondary | ICD-10-CM | POA: Diagnosis present

## 2018-05-26 DIAGNOSIS — F1020 Alcohol dependence, uncomplicated: Secondary | ICD-10-CM | POA: Diagnosis present

## 2018-05-26 DIAGNOSIS — E860 Dehydration: Secondary | ICD-10-CM | POA: Diagnosis present

## 2018-05-26 DIAGNOSIS — R4182 Altered mental status, unspecified: Secondary | ICD-10-CM

## 2018-05-26 DIAGNOSIS — Z823 Family history of stroke: Secondary | ICD-10-CM

## 2018-05-26 DIAGNOSIS — E876 Hypokalemia: Secondary | ICD-10-CM

## 2018-05-26 DIAGNOSIS — D72829 Elevated white blood cell count, unspecified: Secondary | ICD-10-CM

## 2018-05-26 DIAGNOSIS — F909 Attention-deficit hyperactivity disorder, unspecified type: Secondary | ICD-10-CM | POA: Diagnosis present

## 2018-05-26 DIAGNOSIS — T401X1A Poisoning by heroin, accidental (unintentional), initial encounter: Secondary | ICD-10-CM | POA: Diagnosis not present

## 2018-05-26 DIAGNOSIS — Z653 Problems related to other legal circumstances: Secondary | ICD-10-CM

## 2018-05-26 DIAGNOSIS — F1190 Opioid use, unspecified, uncomplicated: Secondary | ICD-10-CM

## 2018-05-26 DIAGNOSIS — M6282 Rhabdomyolysis: Secondary | ICD-10-CM

## 2018-05-26 DIAGNOSIS — R338 Other retention of urine: Secondary | ICD-10-CM | POA: Diagnosis present

## 2018-05-26 DIAGNOSIS — F11288 Opioid dependence with other opioid-induced disorder: Secondary | ICD-10-CM | POA: Diagnosis present

## 2018-05-26 DIAGNOSIS — Z79899 Other long term (current) drug therapy: Secondary | ICD-10-CM

## 2018-05-26 DIAGNOSIS — G92 Toxic encephalopathy: Secondary | ICD-10-CM | POA: Diagnosis present

## 2018-05-26 DIAGNOSIS — R740 Nonspecific elevation of levels of transaminase and lactic acid dehydrogenase [LDH]: Secondary | ICD-10-CM | POA: Diagnosis present

## 2018-05-26 DIAGNOSIS — L89892 Pressure ulcer of other site, stage 2: Secondary | ICD-10-CM | POA: Diagnosis present

## 2018-05-26 DIAGNOSIS — G9341 Metabolic encephalopathy: Secondary | ICD-10-CM | POA: Diagnosis present

## 2018-05-26 LAB — URINALYSIS, ROUTINE W REFLEX MICROSCOPIC
Bacteria, UA: NONE SEEN
Bilirubin Urine: NEGATIVE
Glucose, UA: NEGATIVE mg/dL
Ketones, ur: NEGATIVE mg/dL
Nitrite: NEGATIVE
Protein, ur: NEGATIVE mg/dL
Specific Gravity, Urine: 1.006 (ref 1.005–1.030)
pH: 8 (ref 5.0–8.0)

## 2018-05-26 LAB — COMPREHENSIVE METABOLIC PANEL
ALBUMIN: 3.6 g/dL (ref 3.5–5.0)
ALT: 164 U/L — ABNORMAL HIGH (ref 0–44)
AST: 360 U/L — AB (ref 15–41)
Alkaline Phosphatase: 86 U/L (ref 38–126)
Anion gap: 9 (ref 5–15)
BUN: 16 mg/dL (ref 6–20)
CO2: 26 mmol/L (ref 22–32)
Calcium: 8.5 mg/dL — ABNORMAL LOW (ref 8.9–10.3)
Chloride: 97 mmol/L — ABNORMAL LOW (ref 98–111)
Creatinine, Ser: 0.71 mg/dL (ref 0.61–1.24)
GFR calc Af Amer: >60 mL/min (ref >60)
GFR calc non Af Amer: >60 mL/min (ref >60)
GLUCOSE: 167 mg/dL — AB (ref 70–99)
Potassium: 2.8 mmol/L — ABNORMAL LOW (ref 3.5–5.1)
Sodium: 132 mmol/L — ABNORMAL LOW (ref 135–145)
Total Bilirubin: 0.9 mg/dL (ref 0.3–1.2)
Total Protein: 7.4 g/dL (ref 6.5–8.1)

## 2018-05-26 LAB — LACTIC ACID, PLASMA
Lactic Acid, Venous: 2.4 mmol/L (ref 0.5–1.9)
Lactic Acid, Venous: 1.9 mmol/L (ref 0.5–1.9)

## 2018-05-26 LAB — CBC WITH DIFFERENTIAL/PLATELET
Abs Immature Granulocytes: 0.06 10*3/uL (ref 0.00–0.07)
Basophils Absolute: 0 10*3/uL (ref 0.0–0.1)
Basophils Relative: 0 %
Eosinophils Absolute: 0 10*3/uL (ref 0.0–0.5)
Eosinophils Relative: 0 %
HEMATOCRIT: 44.5 % (ref 39.0–52.0)
Hemoglobin: 15.1 g/dL (ref 13.0–17.0)
Immature Granulocytes: 0 %
LYMPHS ABS: 1.2 10*3/uL (ref 0.7–4.0)
Lymphocytes Relative: 8 %
MCH: 32.2 pg (ref 26.0–34.0)
MCHC: 33.9 g/dL (ref 30.0–36.0)
MCV: 94.9 fL (ref 80.0–100.0)
Monocytes Absolute: 1.3 10*3/uL — ABNORMAL HIGH (ref 0.1–1.0)
Monocytes Relative: 9 %
Neutro Abs: 11.8 10*3/uL — ABNORMAL HIGH (ref 1.7–7.7)
Neutrophils Relative %: 83 %
Platelets: 247 10*3/uL (ref 150–400)
RBC: 4.69 MIL/uL (ref 4.22–5.81)
RDW: 13 % (ref 11.5–15.5)
WBC: 14.4 10*3/uL — ABNORMAL HIGH (ref 4.0–10.5)
nRBC: 0 % (ref 0.0–0.2)

## 2018-05-26 LAB — ACETAMINOPHEN LEVEL: Acetaminophen (Tylenol), Serum: <10 ug/mL — ABNORMAL LOW (ref 10–30)

## 2018-05-26 LAB — RAPID URINE DRUG SCREEN, HOSP PERFORMED
AMPHETAMINES: NOT DETECTED
Barbiturates: NOT DETECTED
Benzodiazepines: NOT DETECTED
Cocaine: NOT DETECTED
Opiates: NOT DETECTED
Tetrahydrocannabinol: NOT DETECTED

## 2018-05-26 LAB — MAGNESIUM: Magnesium: 1.8 mg/dL (ref 1.7–2.4)

## 2018-05-26 LAB — AMMONIA: Ammonia: 38 umol/L — ABNORMAL HIGH (ref 9–35)

## 2018-05-26 LAB — CK: Total CK: 3616 U/L — ABNORMAL HIGH (ref 49–397)

## 2018-05-26 LAB — ETHANOL: Alcohol, Ethyl (B): <10 mg/dL (ref <10)

## 2018-05-26 LAB — SALICYLATE LEVEL: Salicylate Lvl: <7 mg/dL (ref 2.8–30.0)

## 2018-05-26 MED ORDER — VITAMIN B-1 100 MG PO TABS
100.0000 mg | ORAL_TABLET | Freq: Every day | ORAL | Status: DC
  Administered 2018-05-27 – 2018-05-29 (×3): 100 mg via ORAL
  Filled 2018-05-26 (×3): qty 1

## 2018-05-26 MED ORDER — ACETAMINOPHEN 325 MG PO TABS
650.0000 mg | ORAL_TABLET | Freq: Four times a day (QID) | ORAL | Status: DC | PRN
  Administered 2018-05-27 – 2018-05-29 (×3): 650 mg via ORAL
  Filled 2018-05-26 (×3): qty 2

## 2018-05-26 MED ORDER — ACETAMINOPHEN 650 MG RE SUPP: 650.0000 mg | Freq: Four times a day (QID) | RECTAL | Status: DC | PRN

## 2018-05-26 MED ORDER — SODIUM CHLORIDE 0.9 % IV BOLUS
1000.0000 mL | Freq: Once | INTRAVENOUS | Status: AC
  Administered 2018-05-26: 1000 mL via INTRAVENOUS

## 2018-05-26 MED ORDER — POTASSIUM CHLORIDE 10 MEQ/100ML IV SOLN
10.0000 meq | Freq: Once | INTRAVENOUS | Status: AC
  Administered 2018-05-26: 10 meq via INTRAVENOUS
  Filled 2018-05-26: qty 100

## 2018-05-26 MED ORDER — POTASSIUM CHLORIDE CRYS ER 20 MEQ PO TBCR
60.0000 meq | EXTENDED_RELEASE_TABLET | Freq: Once | ORAL | Status: AC
  Administered 2018-05-26: 60 meq via ORAL
  Filled 2018-05-26: qty 3

## 2018-05-26 MED ORDER — LORAZEPAM 2 MG/ML IJ SOLN
1.0000 mg | Freq: Four times a day (QID) | INTRAMUSCULAR | Status: DC | PRN
  Administered 2018-05-27: 1 mg via INTRAVENOUS
  Filled 2018-05-26: qty 1

## 2018-05-26 MED ORDER — SODIUM CHLORIDE 0.9 % IV SOLN
INTRAVENOUS | Status: AC
  Administered 2018-05-26 – 2018-05-27 (×2): via INTRAVENOUS

## 2018-05-26 MED ORDER — LORAZEPAM 1 MG PO TABS: 1.0000 mg | ORAL_TABLET | Freq: Four times a day (QID) | ORAL | Status: DC | PRN

## 2018-05-26 MED ORDER — THIAMINE HCL 100 MG/ML IJ SOLN
Freq: Once | INTRAVENOUS | Status: AC
  Administered 2018-05-26: 22:00:00 via INTRAVENOUS
  Filled 2018-05-26: qty 1000

## 2018-05-26 MED ORDER — ENOXAPARIN SODIUM 40 MG/0.4ML ~~LOC~~ SOLN
40.0000 mg | Freq: Every day | SUBCUTANEOUS | Status: DC
  Administered 2018-05-26 – 2018-05-28 (×3): 40 mg via SUBCUTANEOUS
  Filled 2018-05-26 (×3): qty 0.4

## 2018-05-26 MED ORDER — LORAZEPAM 2 MG/ML IJ SOLN
1.0000 mg | Freq: Once | INTRAMUSCULAR | Status: AC
  Administered 2018-05-26: 1 mg via INTRAVENOUS
  Filled 2018-05-26: qty 1

## 2018-05-26 MED ORDER — FOLIC ACID 1 MG PO TABS
1.0000 mg | ORAL_TABLET | Freq: Every day | ORAL | Status: DC
  Administered 2018-05-27 – 2018-05-29 (×3): 1 mg via ORAL
  Filled 2018-05-26 (×3): qty 1

## 2018-05-26 MED ORDER — ADULT MULTIVITAMIN W/MINERALS CH
1.0000 | ORAL_TABLET | Freq: Every day | ORAL | Status: DC
  Administered 2018-05-27 – 2018-05-29 (×3): 1 via ORAL
  Filled 2018-05-26 (×3): qty 1

## 2018-05-26 MED ORDER — THIAMINE HCL 100 MG/ML IJ SOLN
100.0000 mg | Freq: Every day | INTRAMUSCULAR | Status: DC
  Filled 2018-05-26: qty 2

## 2018-05-26 NOTE — ED Notes (Addendum)
Pt calling out asking for help to void. Pt has foley catheter in place. Lillia Abed, RN made aware and is at bedside speaking with pt.

## 2018-05-26 NOTE — ED Triage Notes (Signed)
Pt mother reports that patient been hallucinating and having delusions since Sunday. Pt daughter got him $50 worth heroin and used it all on Monday.  Pt was at [facility] yesterday and had foley catheter placed for urinary retention. Pt having back pains  Pt denies SI or HI.

## 2018-05-26 NOTE — ED Notes (Signed)
Notified Dennis Tyler that family is concerned about pt and wants him rechecked.

## 2018-05-26 NOTE — ED Notes (Signed)
Pt unable to provide urine specimen at this time

## 2018-05-26 NOTE — H&P (Addendum)
History and Physical    Dennis Tyler WUJ:[id number] DOB: 19-Feb-1971 DOA: 05/26/2018  PCP: Patient, No Pcp Per Patient coming from: Home  Chief Complaint: Altered mental status  HPI: Dennis Tyler is a 48 y.o. male with medical history significant of polysubstance abuse presenting to the hospital for evaluation of altered mental status.  Patient is confused and disoriented.  No history could be obtained from him.  Oriented to self only.  He thinks he is at [employer] and does not know the year.  History provided by girlfriend and mother at bedside.  Girlfriend states that patient has taken 120 pills of Ritalin recently within a 4-day span.  He used heroin 2 days ago.  Yesterday family found the patient to be disoriented and confused.  He was not recognizing family members and saying things which did not make sense.  He was taken to [facility] yesterday and a urinary catheter was placed.  Family states patient drinks alcohol but they are not sure when he last consumed alcohol.  Review of Systems: As per HPI otherwise 10 point review of systems negative.  History reviewed. No pertinent past medical history.  Past Surgical History:  Procedure Laterality Date   APPENDECTOMY     KNEE SURGERY       reports that he has been smoking cigarettes. He has a 20.00 pack-year smoking history. He has never used smokeless tobacco. He reports current alcohol use of about 84.0 standard drinks of alcohol per week. He reports current drug use. Drugs: Marijuana and IV.  No Known Allergies  Family History  Problem Relation Age of Onset   Cancer Other    Stroke Other     Prior to Admission medications   Medication Sig Start Date End Date Taking? Authorizing Provider  acetaminophen (TYLENOL) 500 MG tablet Take 1,000 mg by mouth daily as needed for fever.   Yes [provider]  amLODipine (NORVASC) 10 MG tablet Take 10 mg by mouth daily. 04/18/18  Yes [provider]    aspirin-sod bicarb-citric acid (ALKA-SELTZER) 325 MG TBEF tablet Take 325 mg by mouth daily as needed (indigestion).   Yes [provider]  labetalol (NORMODYNE) 200 MG tablet Take 200 mg by mouth 2 (two) times daily. 04/14/18  Yes [provider]  methylphenidate (RITALIN) 10 MG tablet Take 20 mg by mouth 2 (two) times daily. 05/17/18  Yes [provider]  Zinc Oxide (DESITIN) 13 % CREA Apply 1 application topically daily as needed (bed sore).   Yes [provider]  permethrin (ELIMITE) 5 % cream Apply to affected area once, leaving on for 10 hours, then shower. Patient not taking: Reported on 05/26/2018 11/11/14   Burgess Amor, PA-C    Physical Exam: Vitals:   05/26/18 2225 05/26/18 2230 05/26/18 2245 05/26/18 2307  BP: 136/76 (!) 109/93 134/82 139/78  Pulse: 98 89 92 96  Resp: (!) 21 19 19 14   Temp:    99.3 F (37.4 C)  TempSrc:    Oral  SpO2: 99% 98% 98% 95%    Physical Exam  Constitutional: He appears well-developed and well-nourished. No distress.  HENT:  Head: Normocephalic.  Mouth/Throat: Oropharynx is clear and moist.  Eyes: Pupils are equal, round, and reactive to light. Right eye exhibits no discharge. Left eye exhibits no discharge.  Neck: Neck supple.  Cardiovascular: Normal rate, regular rhythm and intact distal pulses.  Pulmonary/Chest: Effort normal and breath sounds normal. No respiratory distress. He has no wheezes. He has  no rales.  Abdominal: Soft. Bowel sounds are normal. He exhibits no distension. There is no abdominal tenderness. There is no guarding.  Musculoskeletal:        General: No edema.  Neurological: No cranial nerve deficit.  Awake and alert Oriented to self only Following commands appropriately Strength 5 out of 5 in bilateral upper and lower extremities.  Sensation to light touch intact throughout.  Skin: Skin is warm and dry. He is not diaphoretic.  Mild erythema noted in the sacral/gluteal region with 1 very  small fluid-filled blister.     Labs on Admission: I have personally reviewed following labs and imaging studies  CBC: Recent Labs  Lab 05/25/18 1359 05/26/18 1947  WBC 15.5* 14.4*  NEUTROABS 12.6* 11.8*  HGB 16.2 15.1  HCT 47.5 44.5  MCV 94.4 94.9  PLT 244 247   Basic Metabolic Panel: Recent Labs  Lab 05/25/18 1359 05/26/18 1947  NA 130* 132*  K 3.9 2.8*  CL 91* 97*  CO2 30 26  GLUCOSE 187* 167*  BUN 31* 16  CREATININE 0.95 0.71  CALCIUM 8.6* 8.5*   GFR: Estimated Creatinine Clearance: 115.4 mL/min (by C-G formula based on SCr of 0.71 mg/dL). Liver Function Tests: Recent Labs  Lab 05/25/18 1359 05/26/18 1947  AST 427* 360*  ALT 164* 164*  ALKPHOS 85 86  BILITOT 1.2 0.9  PROT 7.2 7.4  ALBUMIN 3.8 3.6   Recent Labs  Lab 05/25/18 1359  LIPASE 18   Recent Labs  Lab 05/25/18 1400 05/26/18 1947  AMMONIA 19 38*   Coagulation Profile: No results for input(s): INR, PROTIME in the last 168 hours. Cardiac Enzymes: Recent Labs  Lab 05/26/18 1947  CKTOTAL 3,616*   BNP (last 3 results) No results for input(s): PROBNP in the last 8760 hours. HbA1C: No results for input(s): HGBA1C in the last 72 hours. CBG: No results for input(s): GLUCAP in the last 168 hours. Lipid Profile: No results for input(s): CHOL, HDL, LDLCALC, TRIG, CHOLHDL, LDLDIRECT in the last 72 hours. Thyroid Function Tests: Recent Labs    05/25/18 1400  TSH 0.407   Anemia Panel: No results for input(s): VITAMINB12, FOLATE, FERRITIN, TIBC, IRON, RETICCTPCT in the last 72 hours. Urine analysis:    Component Value Date/Time   COLORURINE YELLOW 05/25/2018 1440   APPEARANCEUR HAZY (A) 05/25/2018 1440   LABSPEC 1.023 05/25/2018 1440   PHURINE 5.0 05/25/2018 1440   GLUCOSEU 50 (A) 05/25/2018 1440   HGBUR MODERATE (A) 05/25/2018 1440   BILIRUBINUR NEGATIVE 05/25/2018 1440   KETONESUR 20 (A) 05/25/2018 1440   PROTEINUR 30 (A) 05/25/2018 1440   UROBILINOGEN 0.2 02/06/2014 1941    NITRITE NEGATIVE 05/25/2018 1440   LEUKOCYTESUR NEGATIVE 05/25/2018 1440    Radiological Exams on Admission: Ct Head Wo Contrast  Result Date: 05/26/2018 CLINICAL DATA:  Altered level of consciousness. EXAM: CT HEAD WITHOUT CONTRAST TECHNIQUE: Contiguous axial images were obtained from the base of the skull through the vertex without intravenous contrast. COMPARISON:  CT 02/06/2014 FINDINGS: Brain: Ventricle size normal. Symmetric hypodensities in the globus pallidus bilaterally are new since the prior CT. Ill-defined hypodensities in the cerebellum bilaterally also are new. These are likely due to ischemia, possibly acute. Negative for hemorrhage or mass. No midline shift. Vascular: Negative for hyperdense vessel Skull: Negative Sinuses/Orbits: Negative Other: None IMPRESSION: Symmetric hypodensities in the globus pallidus and cerebellum bilaterally suggestive anoxic injury, possibly acute. Carbon monoxide can cause this appearance by CT. The patient has history of recent heroin use  which would appear to be a more likely etiology. MRI brain recommended to evaluate for acute infarction. These results were called by telephone at the time of interpretation on 05/26/2018 at 7:38 pm to Dr. Kristine Royal , who verbally acknowledged these results. Electronically Signed   By: Marlan Palau M.D.   On: 05/26/2018 19:38   Mr Brain Wo Contrast (neuro Protocol)  Result Date: 05/26/2018 CLINICAL DATA:  Altered mental status.  Recent heroin overdose. EXAM: MRI HEAD WITHOUT CONTRAST TECHNIQUE: Multiplanar, multiecho pulse sequences of the brain and surrounding structures were obtained without intravenous contrast. COMPARISON:  Head CT 05/26/2018 FINDINGS: BRAIN: There is abnormal diffusion restriction within both cerebellar hemispheres, both medial temporal lobes of both basal ganglia, in a symmetric distribution. There is associated hyperintense T2-weighted signal at these locations. No acute hemorrhage or extra-axial  collection. The white matter signal is normal for the patient's age. The cerebral and cerebellar volume are age-appropriate. Susceptibility-sensitive sequences show no chronic microhemorrhage or superficial siderosis. VASCULAR: Major intracranial arterial and venous sinus flow voids are normal. SKULL AND UPPER CERVICAL SPINE: Calvarial bone marrow signal is normal. There is no skull base mass. Visualized upper cervical spine and soft tissues are normal. SINUSES/ORBITS: No fluid levels or advanced mucosal thickening. No mastoid or middle ear effusion. The orbits are normal. IMPRESSION: Symmetric, abnormal diffusion restriction within the medial temporal lobes, cerebellar hemispheres and globi pallidi in a pattern typical of heroin induced leukoencephalopathy. Electronically Signed   By: Deatra Robinson M.D.   On: 05/26/2018 21:53    Assessment/Plan Principal Problem:   Acute encephalopathy Active Problems:   Rhabdomyolysis   Lactic acidosis   Hypokalemia   Leukocytosis   Heroin induced leukoencephalopathy -Patient is presenting with a 1 day history of confusion and disorientation.  Per family, he took 120 tablets of Ritalin recently within a 4-day span and used heroin 2 days ago. -Patient continues to be confused and currently oriented to self only. -Afebrile and hemodynamically stable.  Salicylate level normal.  Blood ethanol level negative.  Acetaminophen level negative.  Ammonia level not impressive (38). -Brain MRI showing symmetric abnormal diffusion restriction within the medial temporal lobes, cerebellar hemispheres, and globi pallidi in a pattern typical of heroin induced leukoencephalopathy. -Discussed with neurology, recommendation today is to get an EEG to rule out underlying seizure activity.  In the absence of such, treatment is only supportive for heroine induced leukoencephalopathy. -UDS pending  Rhabdomyolysis -Likely related to drug use and patient possibly being down for a long  period of time. Mild erythema noted in the sacral/gluteal region with 1 very small fluid-filled blister.  Suspect related to pressure injury.  No pain out of proportion to injury or paresthesias to suggest compartment syndrome.  Continue to monitor closely. -CK elevated at 3616. -Aggressive IV fluid hydration -Continue to monitor CK  Mild lactic acidosis -Likely related to rhabdo and dehydration. Lactic acid mildly elevated at 2.4. -Continue IV fluid resuscitation -Continue to trend lactate  Acute urinary retention (resolved) -Patient was seen at [facility], ED yesterday and found to have acute urinary retention.  A Foley catheter was placed.  Foley removed in the ED today and patient has been successfully voiding on his own. -Continue to monitor urine output  Hypokalemia -Potassium 2.8.  Likely related to decreased p.o. intake. -Replete potassium.  Check magnesium level.  Continue to monitor BMP.  Leukocytosis -Likely reactive.  White count 14.4.  Patient is afebrile.  Lungs clear on exam and no respiratory complaints. -Check UA  to rule out UTI   Alcohol abuse -Unclear when patient last consumed alcohol.  Currently not displaying any signs of withdrawal.   -CIWA protocol; Ativan PRN -Folate, multivitamin, thiamine  Elevated transaminases -Likely related to rhabdomyolysis and alcohol abuse.  AST 360 and ALT 164.  Alkaline phosphatase and T bili normal. -Continue to monitor transaminases  DVT prophylaxis: Lovenox Code Status: Full code Family Communication: Mother and girlfriend at bedside. Disposition Plan: Anticipate discharge after clinical improvement. Consults called: Neurology (Dr. Wilford Corner) Admission status: Observation   John Giovanni MD [facility] Pager [phone]  If 7PM-7AM, please contact night-coverage [email or web address] Password [facility]  05/26/2018, 11:08 PM

## 2018-05-26 NOTE — ED Provider Notes (Signed)
[facility]-EMERGENCY DEPT Provider Note   CSN: [id number] Arrival date & time: 05/26/18  1455    History   Chief Complaint Chief Complaint  Patient presents with   Hallucinations    HPI Dennis Tyler is a 48 y.o. male.     48 year old male with prior medical history detailed below presents for evaluation of alteration in his mental status.  Patient is accompanied by his mother.  Mother reports that the patient has a longstanding history of alcohol and polysubstance abuse.  Patient may have used significant amounts of heroin -- specifically "snorted it" on Monday (timeframe uncertain).  He was seen yesterday at [facility] ED for evaluation of similar complaint.  He was diagnosed with urinary retention and arrives today with a Foley catheter in place.  Patient is pleasant.  He is disoriented.  He reports that the year is 2012.  He reports that he is not sure who the current president is.  He does report that he is at the [facility] ED.  His family reports that this confusion is not typical for him.     The history is provided by the patient and medical records.  Altered Mental Status  Presenting symptoms: confusion and disorientation   Severity:  Mild Most recent episode:  2 days ago Episode history:  Single Duration:  2 days Timing:  Constant Progression:  Unchanged Chronicity:  New Context: alcohol use and drug use     History reviewed. No pertinent past medical history.  There are no active problems to display for this patient.   Past Surgical History:  Procedure Laterality Date   APPENDECTOMY     KNEE SURGERY          Home Medications    Prior to Admission medications   Medication Sig Start Date End Date Taking? Authorizing Provider  acetaminophen (TYLENOL) 500 MG tablet Take 1,000 mg by mouth daily as needed for fever.   Yes [provider]  amLODipine (NORVASC) 10 MG tablet Take 10 mg by mouth daily. 04/18/18  Yes  [provider]  aspirin-sod bicarb-citric acid (ALKA-SELTZER) 325 MG TBEF tablet Take 325 mg by mouth daily as needed (indigestion).   Yes [provider]  labetalol (NORMODYNE) 200 MG tablet Take 200 mg by mouth 2 (two) times daily. 04/14/18  Yes [provider]  methylphenidate (RITALIN) 10 MG tablet Take 20 mg by mouth 2 (two) times daily. 05/17/18  Yes [provider]  Zinc Oxide (DESITIN) 13 % CREA Apply 1 application topically daily as needed (bed sore).   Yes [provider]  permethrin (ELIMITE) 5 % cream Apply to affected area once, leaving on for 10 hours, then shower. Patient not taking: Reported on 05/26/2018 11/11/14   Burgess Amor, PA-C    Family History Family History  Problem Relation Age of Onset   Cancer Other    Stroke Other     Social History Social History   Tobacco Use   Smoking status: Current Every Day Smoker    Packs/day: 1.00    Years: 20.00    Pack years: 20.00    Types: Cigarettes   Smokeless tobacco: Never Used  Substance Use Topics   Alcohol use: Yes    Alcohol/week: 84.0 standard drinks    Types: 84 Cans of beer per week   Drug use: Yes    Types: Marijuana, IV    Comment: heroin      Allergies   Patient has no known allergies.  Review of Systems Review of Systems  Psychiatric/Behavioral: Positive for confusion.  All other systems reviewed and are negative.    Physical Exam Updated Vital Signs BP (!) 125/91 (BP Location: Left Arm)    Pulse 87    Temp 97.8 F (36.6 C) (Oral)    Resp 19    SpO2 100%   Physical Exam Vitals signs and nursing note reviewed.  Constitutional:      General: He is not in acute distress.    Appearance: Normal appearance. He is well-developed.  HENT:     Head: Normocephalic and atraumatic.  Eyes:     Conjunctiva/sclera: Conjunctivae normal.     Pupils: Pupils are equal, round, and reactive to light.  Neck:     Musculoskeletal: Normal range of motion and  neck supple.  Cardiovascular:     Rate and Rhythm: Normal rate and regular rhythm.     Heart sounds: Normal heart sounds.  Pulmonary:     Effort: Pulmonary effort is normal. No respiratory distress.     Breath sounds: Normal breath sounds.  Abdominal:     General: There is no distension.     Palpations: Abdomen is soft.     Tenderness: There is no abdominal tenderness.  Musculoskeletal: Normal range of motion.        General: No deformity.  Skin:    General: Skin is warm and dry.  Neurological:     General: No focal deficit present.     Mental Status: He is alert. He is disoriented.     Cranial Nerves: No cranial nerve deficit.     Sensory: No sensory deficit.     Motor: No weakness.     Coordination: Coordination normal.      ED Treatments / Results  Labs (all labs ordered are listed, but only abnormal results are displayed) Labs Reviewed  CBC WITH DIFFERENTIAL/PLATELET - Abnormal; Notable for the following components:      Result Value   WBC 14.4 (*)    Neutro Abs 11.8 (*)    Monocytes Absolute 1.3 (*)    All other components within normal limits  ACETAMINOPHEN LEVEL  ETHANOL  COMPREHENSIVE METABOLIC PANEL  RAPID URINE DRUG SCREEN, HOSP PERFORMED  URINALYSIS, ROUTINE W REFLEX MICROSCOPIC  CK  AMMONIA  SALICYLATE LEVEL  LACTIC ACID, PLASMA  LACTIC ACID, PLASMA    EKG None  Radiology Ct Head Wo Contrast  Result Date: 05/26/2018 CLINICAL DATA:  Altered level of consciousness. EXAM: CT HEAD WITHOUT CONTRAST TECHNIQUE: Contiguous axial images were obtained from the base of the skull through the vertex without intravenous contrast. COMPARISON:  CT 02/06/2014 FINDINGS: Brain: Ventricle size normal. Symmetric hypodensities in the globus pallidus bilaterally are new since the prior CT. Ill-defined hypodensities in the cerebellum bilaterally also are new. These are likely due to ischemia, possibly acute. Negative for hemorrhage or mass. No midline shift. Vascular:  Negative for hyperdense vessel Skull: Negative Sinuses/Orbits: Negative Other: None IMPRESSION: Symmetric hypodensities in the globus pallidus and cerebellum bilaterally suggestive anoxic injury, possibly acute. Carbon monoxide can cause this appearance by CT. The patient has history of recent heroin use which would appear to be a more likely etiology. MRI brain recommended to evaluate for acute infarction. These results were called by telephone at the time of interpretation on 05/26/2018 at 7:38 pm to Dr. Kristine Royal , who verbally acknowledged these results. Electronically Signed   By: Marlan Palau M.D.   On: 05/26/2018 19:38    Procedures Procedures (including  critical care time)  CRITICAL CARE Performed by: Wynetta Fines   Total critical care time: 45 minutes  Critical care time was exclusive of separately billable procedures and treating other patients.  Critical care was necessary to treat or prevent imminent or life-threatening deterioration.  Critical care was time spent personally by me on the following activities: development of treatment plan with patient and/or surrogate as well as nursing, discussions with consultants, evaluation of patient's response to treatment, examination of patient, obtaining history from patient or surrogate, ordering and performing treatments and interventions, ordering and review of laboratory studies, ordering and review of radiographic studies, pulse oximetry and re-evaluation of patient's condition.   Medications Ordered in ED Medications  sodium chloride 0.9 % 1,000 mL with thiamine 100 mg, folic acid 1 mg, multivitamins adult 10 mL infusion (has no administration in time range)     Initial Impression / Assessment and Plan / ED Course  I have reviewed the triage vital signs and the nursing notes.  Pertinent labs & imaging results that were available during my care of the patient were reviewed by me and considered in my medical decision  making (see chart for details).        MDM  Screen complete  Patient is presenting for evaluation of AMS and reported recent use of heroin (via inhalation).  Patient is here today for evaluation of continued altered mental status given recent possible overdose on heroin 2 days prior.  Screening labs do demonstrate elevated CK and Lactic acid. Potassium is also low. LFT's and Ammonia levels also elevated.   Patient was seen yesterday at [facility].  Patient was given a Foley catheter at that time.  Patient has requested his Foley catheter be removed.  This has been done and voiding trial will be observed.   IVF given in ED.   CT brain showed possible anoxic injury.   Dr Aroor (Neuro) is aware of case and will consult. MRI and EEG will be required. Patient can be admitted to [facility].   Hospitalist service is aware of case and will evaluate for admission.     Final Clinical Impressions(s) / ED Diagnoses   Final diagnoses:  Altered mental status, unspecified altered mental status type  Heroin use    ED Discharge Orders    None       Wynetta Fines, MD 05/26/18 2234

## 2018-05-26 NOTE — ED Notes (Signed)
ED TO INPATIENT HANDOFF REPORT  ED Nurse Name and Phone #:  Leatrice Jewels, RN [phone]  S Name/Age/Gender Dennis Tyler 48 y.o. male Room/Bed: WA24/WA24  Code Status   Code Status: Not on file  Home/SNF/Other Home Patient oriented to: self Is this baseline? No   Triage Complete: Triage complete  Chief Complaint catheter removal  Triage Note Pt mother reports that patient been hallucinating and having delusions since Sunday. Pt daughter got him $50 worth heroin and used it all on Monday.  Pt was at [facility] yesterday and had foley catheter placed for urinary retention. Pt having back pains  Pt denies SI or HI.    Allergies No Known Allergies  Level of Care/Admitting Diagnosis ED Disposition    ED Disposition Condition Comment   Admit  Hospital Area: [facility] [[id number]]  Level of Care: Telemetry [5]  Admit to tele based on following criteria: Other see comments  Comments: Monitor hemodynamics  Diagnosis: Acute encephalopathy [196222]  Admitting Physician: John Giovanni [[id number]]  Attending Physician: John Giovanni [[id number]]  PT Class (Do Not Modify): Observation [104]  PT Acc Code (Do Not Modify): Observation [10022]       B Medical/Surgery History History reviewed. No pertinent past medical history. Past Surgical History:  Procedure Laterality Date  . APPENDECTOMY    . KNEE SURGERY       A IV Location/Drains/Wounds Patient Lines/Drains/Airways Status   Active Line/Drains/Airways    Name:   Placement date:   Placement time:   Site:   Days:   Peripheral IV 05/26/18 Left Forearm   05/26/18    1947    Forearm   less than 1   Urethral Catheter Dereck Ligas, NT Straight-tip 16 Fr.   05/25/18    1441    Straight-tip   1          Intake/Output Last 24 hours No intake or output data in the 24 hours ending 05/26/18 2223  Labs/Imaging Results for orders placed or performed during the hospital encounter of 05/26/18 (from the  past 48 hour(s))  Acetaminophen level     Status: Abnormal   Collection Time: 05/26/18  7:47 PM  Result Value Ref Range   Acetaminophen (Tylenol), Serum <10 (L) 10 - 30 ug/mL    Comment: (NOTE) Therapeutic concentrations vary significantly. A range of 10-30 ug/mL  may be an effective concentration for many patients. However, some  are best treated at concentrations outside of this range. Acetaminophen concentrations >150 ug/mL at 4 hours after ingestion  and >50 ug/mL at 12 hours after ingestion are often associated with  toxic reactions. Performed at [facility], [address], [place], [place] [address]   Ethanol     Status: None   Collection Time: 05/26/18  7:47 PM  Result Value Ref Range   Alcohol, Ethyl (B) <10 <10 mg/dL    Comment: (NOTE) Lowest detectable limit for serum alcohol is 10 mg/dL. For medical purposes only. Performed at [facility], [address] [place], [place] [address]   Comprehensive metabolic panel     Status: Abnormal   Collection Time: 05/26/18  7:47 PM  Result Value Ref Range   Sodium 132 (L) 135 - 145 mmol/L   Potassium 2.8 (L) 3.5 - 5.1 mmol/L   Chloride 97 (L) 98 - 111 mmol/L   CO2 26 22 - 32 mmol/L   Glucose, Bld 167 (H) 70 - 99 mg/dL   BUN 16 6 - 20  mg/dL   Creatinine, Ser 1.61 0.61 - 1.24 mg/dL   Calcium 8.5 (L) 8.9 - 10.3 mg/dL   Total Protein 7.4 6.5 - 8.1 g/dL   Albumin 3.6 3.5 - 5.0 g/dL   AST 096 (H) 15 - 41 U/L   ALT 164 (H) 0 - 44 U/L   Alkaline Phosphatase 86 38 - 126 U/L   Total Bilirubin 0.9 0.3 - 1.2 mg/dL   GFR calc non Af Amer >60 >60 mL/min   GFR calc Af Amer >60 >60 mL/min   Anion gap 9 5 - 15    Comment: Performed at [facility], [address], [place], [place] [address]  CBC with Differential     Status: Abnormal   Collection Time: 05/26/18  7:47 PM  Result Value Ref Range   WBC 14.4 (H) 4.0 - 10.5 K/uL   RBC 4.69 4.22 - 5.81 MIL/uL   Hemoglobin 15.1  13.0 - 17.0 g/dL   HCT 98.1 19.1 - 47.8 %   MCV 94.9 80.0 - 100.0 fL   MCH 32.2 26.0 - 34.0 pg   MCHC 33.9 30.0 - 36.0 g/dL   RDW 29.5 62.1 - 30.8 %   Platelets 247 150 - 400 K/uL   nRBC 0.0 0.0 - 0.2 %   Neutrophils Relative % 83 %   Neutro Abs 11.8 (H) 1.7 - 7.7 K/uL   Lymphocytes Relative 8 %   Lymphs Abs 1.2 0.7 - 4.0 K/uL   Monocytes Relative 9 %   Monocytes Absolute 1.3 (H) 0.1 - 1.0 K/uL   Eosinophils Relative 0 %   Eosinophils Absolute 0.0 0.0 - 0.5 K/uL   Basophils Relative 0 %   Basophils Absolute 0.0 0.0 - 0.1 K/uL   Immature Granulocytes 0 %   Abs Immature Granulocytes 0.06 0.00 - 0.07 K/uL    Comment: Performed at [facility], [address], [place], [place] [address]  CK     Status: Abnormal   Collection Time: 05/26/18  7:47 PM  Result Value Ref Range   Total CK 3,616 (H) 49 - 397 U/L    Comment: RESULTS CONFIRMED BY MANUAL DILUTION Performed at [facility], [address] [place], [place] [address]   Ammonia     Status: Abnormal   Collection Time: 05/26/18  7:47 PM  Result Value Ref Range   Ammonia 38 (H) 9 - 35 umol/L    Comment: Performed at [facility], [address], [place], [place] [address]  Salicylate level     Status: None   Collection Time: 05/26/18  7:47 PM  Result Value Ref Range   Salicylate Lvl <7.0 2.8 - 30.0 mg/dL    Comment: Performed at [facility], [address], [place], [place] [address]  Lactic acid, plasma     Status: Abnormal   Collection Time: 05/26/18  7:47 PM  Result Value Ref Range   Lactic Acid, Venous 2.4 (HH) 0.5 - 1.9 mmol/L    Comment: CRITICAL RESULT CALLED TO, READ BACK BY AND VERIFIED WITH: L Marquel Pottenger RN 2022 05/26/18 A NAVARRO Performed at [facility], [address] [place], [place] [address]    Ct Head Wo Contrast  Result Date: 05/26/2018 CLINICAL DATA:  Altered level of consciousness. EXAM: CT HEAD WITHOUT  CONTRAST TECHNIQUE: Contiguous axial images were obtained from the base of the skull through the vertex without intravenous contrast. COMPARISON:  CT 02/06/2014 FINDINGS: Brain: Ventricle size normal.  Symmetric hypodensities in the globus pallidus bilaterally are new since the prior CT. Ill-defined hypodensities in the cerebellum bilaterally also are new. These are likely due to ischemia, possibly acute. Negative for hemorrhage or mass. No midline shift. Vascular: Negative for hyperdense vessel Skull: Negative Sinuses/Orbits: Negative Other: None IMPRESSION: Symmetric hypodensities in the globus pallidus and cerebellum bilaterally suggestive anoxic injury, possibly acute. Carbon monoxide can cause this appearance by CT. The patient has history of recent heroin use which would appear to be a more likely etiology. MRI brain recommended to evaluate for acute infarction. These results were called by telephone at the time of interpretation on 05/26/2018 at 7:38 pm to Dr. Kristine Royal , who verbally acknowledged these results. Electronically Signed   By: Marlan Palau M.D.   On: 05/26/2018 19:38   Mr Brain Wo Contrast (neuro Protocol)  Result Date: 05/26/2018 CLINICAL DATA:  Altered mental status.  Recent heroin overdose. EXAM: MRI HEAD WITHOUT CONTRAST TECHNIQUE: Multiplanar, multiecho pulse sequences of the brain and surrounding structures were obtained without intravenous contrast. COMPARISON:  Head CT 05/26/2018 FINDINGS: BRAIN: There is abnormal diffusion restriction within both cerebellar hemispheres, both medial temporal lobes of both basal ganglia, in a symmetric distribution. There is associated hyperintense T2-weighted signal at these locations. No acute hemorrhage or extra-axial collection. The white matter signal is normal for the patient's age. The cerebral and cerebellar volume are age-appropriate. Susceptibility-sensitive sequences show no chronic microhemorrhage or superficial siderosis. VASCULAR:  Major intracranial arterial and venous sinus flow voids are normal. SKULL AND UPPER CERVICAL SPINE: Calvarial bone marrow signal is normal. There is no skull base mass. Visualized upper cervical spine and soft tissues are normal. SINUSES/ORBITS: No fluid levels or advanced mucosal thickening. No mastoid or middle ear effusion. The orbits are normal. IMPRESSION: Symmetric, abnormal diffusion restriction within the medial temporal lobes, cerebellar hemispheres and globi pallidi in a pattern typical of heroin induced leukoencephalopathy. Electronically Signed   By: Deatra Robinson M.D.   On: 05/26/2018 21:53    Pending Labs Unresulted Labs (From admission, onward)    Start     Ordered   05/26/18 1912  Lactic acid, plasma  Now then every 2 hours,   STAT     05/26/18 1911   05/26/18 1908  Urine rapid drug screen (hosp performed)  ONCE - STAT,   STAT     05/26/18 1908   05/26/18 1908  Urinalysis, Routine w reflex microscopic  ONCE - STAT,   STAT     05/26/18 1908          Vitals/Pain Today's Vitals   05/26/18 1508 05/26/18 1510 05/26/18 2130 05/26/18 2136  BP:  (!) 125/91 (!) 125/93 (!) 125/93  Pulse:  87 (!) 101 88  Resp:  19  18  Temp:  97.8 F (36.6 C)  97.8 F (36.6 C)  TempSrc:  Oral  Oral  SpO2:  100% 97% 98%  PainSc: 7        Isolation Precautions No active isolations  Medications Medications  potassium chloride 10 mEq in 100 mL IVPB (10 mEq Intravenous New Bag/Given 05/26/18 2143)  sodium chloride 0.9 % 1,000 mL with thiamine 100 mg, folic acid 1 mg, multivitamins adult 10 mL infusion ( Intravenous New Bag/Given 05/26/18 2130)  sodium chloride 0.9 % bolus 1,000 mL (1,000 mLs Intravenous New Bag/Given 05/26/18 2142)  LORazepam (ATIVAN) injection 1 mg (1 mg Intravenous Given 05/26/18 2058)  sodium chloride 0.9 % bolus 1,000 mL (1,000 mLs Intravenous New Bag/Given 05/26/18  2141)    Mobility walks Low fall risk   Focused Assessments    R Recommendations: See Admitting  Provider Note  Report given to:   Additional Notes:

## 2018-05-26 NOTE — ED Notes (Signed)
Pt is attempting to give urine sample currently.

## 2018-05-26 NOTE — Progress Notes (Addendum)
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  MRI confirmed these findings.  I have reviewed CT and MRI. Also has multiple laboratory derangements pointing towards rhabdomyolysis. Spoke with the ED provider as well as the admitting hospitalist. From a neurological standpoint, obtain an EEG to rule out any underlying seizure activity but in the absence of such, treatment is only supportive for heroin induced leukoencephalopathy's. Please call neurology if the EEG is of any concern for seizures. Neurology will be able to see the patient then and advise further in the case.  -- Milon Dikes, MD [facility] Pager: [phone] If 7pm to 7am, please call on call as listed on AMION.

## 2018-05-27 ENCOUNTER — Inpatient Hospital Stay (HOSPITAL_COMMUNITY)
Admit: 2018-05-27 | Discharge: 2018-05-27 | Disposition: A | Discharge status: Home / Self Care | Payer: Medicare Other | Attending: Internal Medicine | Admitting: Internal Medicine

## 2018-05-27 DIAGNOSIS — T401X1A Poisoning by heroin, accidental (unintentional), initial encounter: Secondary | ICD-10-CM | POA: Diagnosis present

## 2018-05-27 DIAGNOSIS — Z823 Family history of stroke: Secondary | ICD-10-CM | POA: Diagnosis not present

## 2018-05-27 DIAGNOSIS — Z653 Problems related to other legal circumstances: Secondary | ICD-10-CM | POA: Diagnosis not present

## 2018-05-27 DIAGNOSIS — F17210 Nicotine dependence, cigarettes, uncomplicated: Secondary | ICD-10-CM | POA: Diagnosis present

## 2018-05-27 DIAGNOSIS — E872 Acidosis: Secondary | ICD-10-CM

## 2018-05-27 DIAGNOSIS — F1020 Alcohol dependence, uncomplicated: Secondary | ICD-10-CM | POA: Diagnosis present

## 2018-05-27 DIAGNOSIS — R4182 Altered mental status, unspecified: Secondary | ICD-10-CM | POA: Diagnosis present

## 2018-05-27 DIAGNOSIS — G9341 Metabolic encephalopathy: Secondary | ICD-10-CM | POA: Diagnosis present

## 2018-05-27 DIAGNOSIS — D72829 Elevated white blood cell count, unspecified: Secondary | ICD-10-CM

## 2018-05-27 DIAGNOSIS — R338 Other retention of urine: Secondary | ICD-10-CM | POA: Diagnosis present

## 2018-05-27 DIAGNOSIS — G92 Toxic encephalopathy: Secondary | ICD-10-CM | POA: Diagnosis present

## 2018-05-27 DIAGNOSIS — L89892 Pressure ulcer of other site, stage 2: Secondary | ICD-10-CM | POA: Diagnosis present

## 2018-05-27 DIAGNOSIS — E860 Dehydration: Secondary | ICD-10-CM | POA: Diagnosis present

## 2018-05-27 DIAGNOSIS — R740 Nonspecific elevation of levels of transaminase and lactic acid dehydrogenase [LDH]: Secondary | ICD-10-CM | POA: Diagnosis present

## 2018-05-27 DIAGNOSIS — F11288 Opioid dependence with other opioid-induced disorder: Secondary | ICD-10-CM | POA: Diagnosis present

## 2018-05-27 DIAGNOSIS — Z79899 Other long term (current) drug therapy: Secondary | ICD-10-CM | POA: Diagnosis not present

## 2018-05-27 DIAGNOSIS — M6282 Rhabdomyolysis: Secondary | ICD-10-CM | POA: Diagnosis present

## 2018-05-27 DIAGNOSIS — E876 Hypokalemia: Secondary | ICD-10-CM | POA: Diagnosis present

## 2018-05-27 DIAGNOSIS — F909 Attention-deficit hyperactivity disorder, unspecified type: Secondary | ICD-10-CM | POA: Diagnosis present

## 2018-05-27 LAB — BASIC METABOLIC PANEL
Anion gap: 8 (ref 5–15)
BUN: 12 mg/dL (ref 6–20)
CO2: 24 mmol/L (ref 22–32)
Calcium: 7.9 mg/dL — ABNORMAL LOW (ref 8.9–10.3)
Chloride: 106 mmol/L (ref 98–111)
Creatinine, Ser: 0.63 mg/dL (ref 0.61–1.24)
GFR calc Af Amer: >60 mL/min (ref >60)
GFR calc non Af Amer: >60 mL/min (ref >60)
Glucose, Bld: 104 mg/dL — ABNORMAL HIGH (ref 70–99)
Potassium: 3.4 mmol/L — ABNORMAL LOW (ref 3.5–5.1)
Sodium: 138 mmol/L (ref 135–145)

## 2018-05-27 LAB — CBC
HCT: 40.1 % (ref 39.0–52.0)
Hemoglobin: 13.2 g/dL (ref 13.0–17.0)
MCH: 31.9 pg (ref 26.0–34.0)
MCHC: 32.9 g/dL (ref 30.0–36.0)
MCV: 96.9 fL (ref 80.0–100.0)
PLATELETS: 224 10*3/uL (ref 150–400)
RBC: 4.14 MIL/uL — ABNORMAL LOW (ref 4.22–5.81)
RDW: 13.1 % (ref 11.5–15.5)
WBC: 9.8 10*3/uL (ref 4.0–10.5)
nRBC: 0 % (ref 0.0–0.2)

## 2018-05-27 LAB — HEPATIC FUNCTION PANEL
ALT: 128 U/L — ABNORMAL HIGH (ref 0–44)
AST: 228 U/L — ABNORMAL HIGH (ref 15–41)
Albumin: 2.9 g/dL — ABNORMAL LOW (ref 3.5–5.0)
Alkaline Phosphatase: 69 U/L (ref 38–126)
Bilirubin, Direct: 0.1 mg/dL (ref 0.0–0.2)
Indirect Bilirubin: 0.7 mg/dL (ref 0.3–0.9)
TOTAL PROTEIN: 5.9 g/dL — AB (ref 6.5–8.1)
Total Bilirubin: 0.8 mg/dL (ref 0.3–1.2)

## 2018-05-27 LAB — COMPREHENSIVE METABOLIC PANEL
ALBUMIN: 3.3 g/dL — AB (ref 3.5–5.0)
ALT: 135 U/L — ABNORMAL HIGH (ref 0–44)
AST: 226 U/L — ABNORMAL HIGH (ref 15–41)
Alkaline Phosphatase: 97 U/L (ref 38–126)
Anion gap: 5 (ref 5–15)
BUN: 10 mg/dL (ref 6–20)
CO2: 25 mmol/L (ref 22–32)
CREATININE: 0.68 mg/dL (ref 0.61–1.24)
Calcium: 8.3 mg/dL — ABNORMAL LOW (ref 8.9–10.3)
Chloride: 109 mmol/L (ref 98–111)
GFR calc Af Amer: >60 mL/min (ref >60)
GFR calc non Af Amer: >60 mL/min (ref >60)
Glucose, Bld: 97 mg/dL (ref 70–99)
Potassium: 3.6 mmol/L (ref 3.5–5.1)
Sodium: 139 mmol/L (ref 135–145)
Total Bilirubin: 0.4 mg/dL (ref 0.3–1.2)
Total Protein: 6.6 g/dL (ref 6.5–8.1)

## 2018-05-27 LAB — HIV ANTIBODY (ROUTINE TESTING W REFLEX): HIV SCREEN 4TH GENERATION: NONREACTIVE

## 2018-05-27 LAB — MAGNESIUM: Magnesium: 1.9 mg/dL (ref 1.7–2.4)

## 2018-05-27 LAB — CK: Total CK: 5849 U/L — ABNORMAL HIGH (ref 49–397)

## 2018-05-27 MED ORDER — LORAZEPAM 2 MG/ML IJ SOLN
2.0000 mg | INTRAMUSCULAR | Status: DC | PRN
  Administered 2018-05-28: 3 mg via INTRAVENOUS
  Filled 2018-05-27: qty 2

## 2018-05-27 MED ORDER — SODIUM CHLORIDE 0.9 % IV SOLN
INTRAVENOUS | Status: AC
  Administered 2018-05-27: 12:00:00 via INTRAVENOUS

## 2018-05-27 MED ORDER — SODIUM CHLORIDE 0.9 % IV SOLN
INTRAVENOUS | Status: DC
  Administered 2018-05-27 – 2018-05-29 (×7): via INTRAVENOUS

## 2018-05-27 MED ORDER — POTASSIUM CHLORIDE CRYS ER 20 MEQ PO TBCR
60.0000 meq | EXTENDED_RELEASE_TABLET | Freq: Once | ORAL | Status: AC
  Administered 2018-05-27: 60 meq via ORAL
  Filled 2018-05-27: qty 3

## 2018-05-27 NOTE — Progress Notes (Addendum)
PROGRESS NOTE  Dennis Tyler EBR:[id number] DOB: 1970/11/16 DOA: 05/26/2018 PCP: Patient, No Pcp Per   LOS: 0 days   Brief narrative:  Dennis Tyler is a 48 y.o. male with medical history significant of polysubstance abuse presenting to the hospital for evaluation of altered mental status.  Patient is confused and disoriented.  No history could be obtained from him.  Oriented to self only.  He thinks he is at [employer] and does not know the year.  History provided by girlfriend and mother at bedside.  Girlfriend states that patient has taken 120 pills of Ritalin recently within a 4-day span.  He used heroin 2 days ago.  Yesterday family found the patient to be disoriented and confused.  He was not recognizing family members and saying things which did not make sense.  He was taken to [facility] yesterday and a urinary catheter was placed.  Family states patient drinks alcohol but they are not sure when he last consumed alcohol.  Subjective: Nursing staff reported that the patient was a little confused today.  He did have a problems with urinary retention  and bladder scan was significant for urinary retention.  His mom at bedside.  Assessment/Plan:  Principal Problem:   Acute encephalopathy Active Problems:   Rhabdomyolysis   Lactic acidosis   Hypokalemia   Leukocytosis  Heroin induced leukoencephalopathy -Patient has been seen by neurology.  Will treat symptomatically.  Will closely monitor.  Patient is also on CIWA protocol for alcohol abuse.  Continue on thiamine folic acid.  Hemodynamically stable at this time. Brain MRI showing symmetric abnormal diffusion restriction within the medial temporal lobes, cerebellar hemispheres, and globi pallidi in a pattern typical of heroin induced leukoencephalopathy.  Neurology recommended EEG.  Acute rhabdomyolysis Trending up CK levels today.  Continue on aggressive IV fluids.  Will monitor CK levels in a.m.  Renal function is a stable  so far.  Mild lactic acidosis -Likely related to rhabdo and dehydration. Lactic acid mildly elevated at 2.4 presentation.  Repeat is 1.9.  Continue IV fluids..  Acute urinary retention  Bladder scan shows significant urine.  Will try in and out cath.  If he continues to retain urine will place in a Foley catheter.  Hypokalemia Continue to replete lites.  Slightly improved compared to patient.  Leukocytosis -Likely reactive.    Normalized today.  Alcohol abuse He is confused but no signs of withdrawal at this time.  Continue CIWA protocol.  Continue Folate, multivitamin, thiamine  Elevated transaminases -Likely related to rhabdomyolysis and alcohol abuse.  AST 360 and ALT 164.  Alkaline phosphatase and T bili normal. Continue to monitor transaminases   VTE Prophylaxis: Lovenox  Code Status: Full code  Family Communication: Spoke with the with the patient's family member at bedside.  Disposition Plan: Home likely in 1 to 2 days.  We will change the patient's history to inpatient at this time due to the need for ongoing IV fluids due to rhabdomyolysis, ongoing confusion needing monitoring, possibility of alcohol withdrawal and drug withdrawal.  Patient will also need EEG and further monitoring.   Consultants:  Neurology  Procedures:  MRI of the brain, EEG pending  Antibiotics: Anti-infectives (From admission, onward)   None      Objective: Vitals:   05/26/18 2307 05/27/18 0525  BP: 139/78 134/73  Pulse: 96 69  Resp: 14 17  Temp: 99.3 F (37.4 C) 98.4 F (36.9 C)  SpO2: 95% 95%    Intake/Output Summary (Last  24 hours) at 05/27/2018 1158 Last data filed at 05/27/2018 0600 Gross per 24 hour  Intake 777.32 ml  Output 575 ml  Net 202.32 ml   Filed Weights   05/26/18 2307  Weight: 86.3 kg   Body mass index is 29.8 kg/m.   Physical Exam: GENERAL: Patient is alert awake but confused.. Not in obvious distress. HENT: No scleral pallor or icterus. Pupils  equally reactive to light. Oral mucosa is moist NECK: is supple, no palpable thyroid enlargement. CHEST: Clear to auscultation. No crackles or wheezes. Non tender on palpation. Diminished breath sounds bilaterally. CVS: S1 and S2 heard, no murmur. Regular rate and rhythm. No pericardial rub. ABDOMEN: Soft, non-tender, bowel sounds are present. No palpable hepato-splenomegaly. EXTREMITIES: No edema. CNS: Cranial nerves are intact. No focal motor or sensory deficits. SKIN: warm and dry, mild erythema noted in the sacral gluteal region  Data Review: I have personally reviewed the following laboratory data and studies,  CBC: Recent Labs  Lab 05/25/18 1359 05/26/18 1947 05/27/18 0603  WBC 15.5* 14.4* 9.8  NEUTROABS 12.6* 11.8*  --   HGB 16.2 15.1 13.2  HCT 47.5 44.5 40.1  MCV 94.4 94.9 96.9  PLT 244 247 224   Basic Metabolic Panel: Recent Labs  Lab 05/25/18 1359 05/26/18 1947 05/26/18 2321 05/27/18 0603  NA 130* 132*  --  138  K 3.9 2.8*  --  3.4*  CL 91* 97*  --  106  CO2 30 26  --  24  GLUCOSE 187* 167*  --  104*  BUN 31* 16  --  12  CREATININE 0.95 0.71  --  0.63  CALCIUM 8.6* 8.5*  --  7.9*  MG  --   --  1.8  --    Liver Function Tests: Recent Labs  Lab 05/25/18 1359 05/26/18 1947 05/27/18 0603  AST 427* 360* 228*  ALT 164* 164* 128*  ALKPHOS 85 86 69  BILITOT 1.2 0.9 0.8  PROT 7.2 7.4 5.9*  ALBUMIN 3.8 3.6 2.9*   Recent Labs  Lab 05/25/18 1359  LIPASE 18   Recent Labs  Lab 05/25/18 1400 05/26/18 1947  AMMONIA 19 38*   Cardiac Enzymes: Recent Labs  Lab 05/26/18 1947 05/27/18 0603  CKTOTAL 3,616* 5,849*   BNP (last 3 results) No results for input(s): BNP in the last 8760 hours.  ProBNP (last 3 results) No results for input(s): PROBNP in the last 8760 hours.  CBG: No results for input(s): GLUCAP in the last 168 hours. No results found for this or any previous visit (from the past 240 hour(s)).   Studies: Ct Head Wo Contrast  Result  Date: 05/26/2018 CLINICAL DATA:  Altered level of consciousness. EXAM: CT HEAD WITHOUT CONTRAST TECHNIQUE: Contiguous axial images were obtained from the base of the skull through the vertex without intravenous contrast. COMPARISON:  CT 02/06/2014 FINDINGS: Brain: Ventricle size normal. Symmetric hypodensities in the globus pallidus bilaterally are new since the prior CT. Ill-defined hypodensities in the cerebellum bilaterally also are new. These are likely due to ischemia, possibly acute. Negative for hemorrhage or mass. No midline shift. Vascular: Negative for hyperdense vessel Skull: Negative Sinuses/Orbits: Negative Other: None IMPRESSION: Symmetric hypodensities in the globus pallidus and cerebellum bilaterally suggestive anoxic injury, possibly acute. Carbon monoxide can cause this appearance by CT. The patient has history of recent heroin use which would appear to be a more likely etiology. MRI brain recommended to evaluate for acute infarction. These results were called by telephone at the  time of interpretation on 05/26/2018 at 7:38 pm to Dr. Kristine Royal , who verbally acknowledged these results. Electronically Signed   By: Marlan Palau M.D.   On: 05/26/2018 19:38   Mr Brain Wo Contrast (neuro Protocol)  Result Date: 05/26/2018 CLINICAL DATA:  Altered mental status.  Recent heroin overdose. EXAM: MRI HEAD WITHOUT CONTRAST TECHNIQUE: Multiplanar, multiecho pulse sequences of the brain and surrounding structures were obtained without intravenous contrast. COMPARISON:  Head CT 05/26/2018 FINDINGS: BRAIN: There is abnormal diffusion restriction within both cerebellar hemispheres, both medial temporal lobes of both basal ganglia, in a symmetric distribution. There is associated hyperintense T2-weighted signal at these locations. No acute hemorrhage or extra-axial collection. The white matter signal is normal for the patient's age. The cerebral and cerebellar volume are age-appropriate.  Susceptibility-sensitive sequences show no chronic microhemorrhage or superficial siderosis. VASCULAR: Major intracranial arterial and venous sinus flow voids are normal. SKULL AND UPPER CERVICAL SPINE: Calvarial bone marrow signal is normal. There is no skull base mass. Visualized upper cervical spine and soft tissues are normal. SINUSES/ORBITS: No fluid levels or advanced mucosal thickening. No mastoid or middle ear effusion. The orbits are normal. IMPRESSION: Symmetric, abnormal diffusion restriction within the medial temporal lobes, cerebellar hemispheres and globi pallidi in a pattern typical of heroin induced leukoencephalopathy. Electronically Signed   By: Deatra Robinson M.D.   On: 05/26/2018 21:53    Scheduled Meds: . enoxaparin (LOVENOX) injection  40 mg Subcutaneous QHS  . folic acid  1 mg Oral Daily  . multivitamin with minerals  1 tablet Oral Daily  . potassium chloride  60 mEq Oral Once  . thiamine  100 mg Oral Daily   Or  . thiamine  100 mg Intravenous Daily    Continuous Infusions: . sodium chloride       Joycelyn Das, MD  [facility] 05/27/2018

## 2018-05-27 NOTE — Progress Notes (Signed)
Offsite EEG completed, results pending. 

## 2018-05-27 NOTE — Plan of Care (Signed)
  Problem: Education: Goal: Knowledge of General Education information will improve Description Including pain rating scale, medication(s)/side effects and non-pharmacologic comfort measures Outcome: Progressing   Problem: Health Behavior/Discharge Planning: Goal: Ability to manage health-related needs will improve Outcome: Progressing   Problem: Clinical Measurements: Goal: Ability to maintain clinical measurements within normal limits will improve Outcome: Progressing   Problem: Elimination: Goal: Will not experience complications related to urinary retention Outcome: Progressing   Problem: Clinical Measurements: Goal: Will remain free from infection Outcome: Adequate for Discharge Goal: Diagnostic test results will improve Outcome: Adequate for Discharge Goal: Respiratory complications will improve Outcome: Adequate for Discharge Goal: Cardiovascular complication will be avoided Outcome: Adequate for Discharge   Problem: Activity: Goal: Risk for activity intolerance will decrease Outcome: Adequate for Discharge   Problem: Nutrition: Goal: Adequate nutrition will be maintained Outcome: Adequate for Discharge   Problem: Coping: Goal: Level of anxiety will decrease Outcome: Adequate for Discharge   Problem: Elimination: Goal: Will not experience complications related to bowel motility Outcome: Adequate for Discharge   Problem: Pain Managment: Goal: General experience of comfort will improve Outcome: Adequate for Discharge   Problem: Safety: Goal: Ability to remain free from injury will improve Outcome: Adequate for Discharge   Problem: Skin Integrity: Goal: Risk for impaired skin integrity will decrease Outcome: Adequate for Discharge

## 2018-05-27 NOTE — Procedures (Signed)
ELECTROENCEPHALOGRAM REPORT   Patient: Dennis Tyler       Room #: 1401 EEG No. ID: [id number] Age: 48 y.o.        Sex: male Referring Physician: Pokhrel Report Date:  05/27/2018        Interpreting Physician: Thana Farr  History: Dennis Tyler is an 48 y.o. male with altered mental  status  Medications:  MVI, Folvite, Thiamine  Conditions of Recording:  This is a 21 channel routine scalp EEG performed with bipolar and monopolar montages arranged in accordance to the international 10/20 system of electrode placement. One channel was dedicated to EKG recording.  The patient is in the awake, drowsy and asleep states.  Description:  The waking background activity consists of a low voltage, symmetrical, fairly well organized, 9-10 Hz alpha activity, seen from the parieto-occipital and posterior temporal regions.  Low voltage fast activity, poorly organized, is seen anteriorly and is at times superimposed on more posterior regions.  A mixture of theta and alpha rhythms are seen from the central and temporal regions. The patient drowses with slowing to irregular, low voltage theta and beta activity.   The patient goes in to a light sleep with symmetrical sleep spindles, vertex central sharp transients and irregular slow activity.  No epileptiform activity is noted.   Hyperventilation and intermittent photic stimulation were not performed.   IMPRESSION: Normal electroencephalogram, awake, drowsy and asleep. There are no focal lateralizing or epileptiform features.   Thana Farr, MD Neurology [phone] 05/27/2018, 6:18 PM

## 2018-05-28 DIAGNOSIS — G9341 Metabolic encephalopathy: Secondary | ICD-10-CM

## 2018-05-28 DIAGNOSIS — L8990 Pressure ulcer of unspecified site, unspecified stage: Secondary | ICD-10-CM

## 2018-05-28 LAB — CBC
HCT: 42.5 % (ref 39.0–52.0)
Hemoglobin: 13.4 g/dL (ref 13.0–17.0)
MCH: 31.4 pg (ref 26.0–34.0)
MCHC: 31.5 g/dL (ref 30.0–36.0)
MCV: 99.5 fL (ref 80.0–100.0)
PLATELETS: 240 10*3/uL (ref 150–400)
RBC: 4.27 MIL/uL (ref 4.22–5.81)
RDW: 13 % (ref 11.5–15.5)
WBC: 7.4 10*3/uL (ref 4.0–10.5)
nRBC: 0 % (ref 0.0–0.2)

## 2018-05-28 LAB — URINE CULTURE: Culture: NO GROWTH

## 2018-05-28 LAB — COMPREHENSIVE METABOLIC PANEL
ALT: 110 U/L — ABNORMAL HIGH (ref 0–44)
AST: 149 U/L — ABNORMAL HIGH (ref 15–41)
Albumin: 2.8 g/dL — ABNORMAL LOW (ref 3.5–5.0)
Alkaline Phosphatase: 80 U/L (ref 38–126)
Anion gap: 9 (ref 5–15)
BUN: 7 mg/dL (ref 6–20)
CO2: 23 mmol/L (ref 22–32)
Calcium: 8.4 mg/dL — ABNORMAL LOW (ref 8.9–10.3)
Chloride: 107 mmol/L (ref 98–111)
Creatinine, Ser: 0.6 mg/dL — ABNORMAL LOW (ref 0.61–1.24)
GFR calc Af Amer: >60 mL/min (ref >60)
Glucose, Bld: 99 mg/dL (ref 70–99)
Potassium: 3.3 mmol/L — ABNORMAL LOW (ref 3.5–5.1)
Sodium: 139 mmol/L (ref 135–145)
Total Bilirubin: 0.6 mg/dL (ref 0.3–1.2)
Total Protein: 5.9 g/dL — ABNORMAL LOW (ref 6.5–8.1)

## 2018-05-28 LAB — MAGNESIUM: MAGNESIUM: 1.8 mg/dL (ref 1.7–2.4)

## 2018-05-28 LAB — PHOSPHORUS: PHOSPHORUS: 2.5 mg/dL (ref 2.5–4.6)

## 2018-05-28 LAB — CK: CK TOTAL: 2472 U/L — AB (ref 49–397)

## 2018-05-28 MED ORDER — POTASSIUM CHLORIDE CRYS ER 20 MEQ PO TBCR
60.0000 meq | EXTENDED_RELEASE_TABLET | Freq: Once | ORAL | Status: AC
  Administered 2018-05-28: 60 meq via ORAL
  Filled 2018-05-28: qty 3

## 2018-05-28 MED ORDER — MAGNESIUM OXIDE 400 (241.3 MG) MG PO TABS
400.0000 mg | ORAL_TABLET | Freq: Two times a day (BID) | ORAL | Status: DC
  Administered 2018-05-28 – 2018-05-29 (×3): 400 mg via ORAL
  Filled 2018-05-28 (×3): qty 1

## 2018-05-28 MED ORDER — POLYETHYLENE GLYCOL 3350 17 G PO PACK
17.0000 g | PACK | Freq: Every day | ORAL | Status: DC
  Administered 2018-05-28 – 2018-05-29 (×2): 17 g via ORAL
  Filled 2018-05-28 (×2): qty 1

## 2018-05-28 MED ORDER — DOCUSATE SODIUM 100 MG PO CAPS
100.0000 mg | ORAL_CAPSULE | Freq: Two times a day (BID) | ORAL | Status: DC
  Administered 2018-05-28 – 2018-05-29 (×3): 100 mg via ORAL
  Filled 2018-05-28 (×3): qty 1

## 2018-05-28 MED ORDER — ZINC OXIDE 40 % EX OINT
TOPICAL_OINTMENT | Freq: Two times a day (BID) | CUTANEOUS | Status: DC | PRN
  Filled 2018-05-28: qty 57

## 2018-05-28 NOTE — Consult Note (Signed)
WOC Nurse wound consult note Reason for Consult: Consult requested for buttocks.  Pt was down at home for an unknown period of time, according to family members, and was wearing jeans.  He has the outline of some type of object to bilat buttocks; generalized erythremia and edema but it blanches when touched. Approx 18X18cm, very straight linear edge at the top with irregular edges to other 3 sides.  Pressure Injury POA: Yes, this was present on admission and patchy areas to inner gluteal fold have evolved into stage 2 pressure injuries; affected area is approx 5X2X.1cm Wound bed: Inner gluteal fold with clear fluid-filled blisters which have ruptured in patchy areas and have red moist wounds surrounded by loose peeling skin and small amt clear drainage. Dressing procedure/placement/frequency: Desitin cream to promote healing, foam dressing to protect from further injury to inner gluteal fold.  Air mattress to reduce discomfort.  Discussed plan of care with patient and family members at the bedside. Please re-consult if further assistance is needed.  Thank-you,  Cammie Mcgee MSN, RN, CWOCN, [place], CNS [phone]

## 2018-05-28 NOTE — Progress Notes (Addendum)
PROGRESS NOTE  Dennis MedinaWilliam Spease ZOX:[id number] DOB: 07/12/1970 DOA: 05/26/2018 PCP: Patient, No Pcp Per   LOS: 1 day   Brief narrative:  Dennis Tyler is a 48 y.o. male with medical history significant of polysubstance abuse presented to the hospital for evaluation of altered mental status.  Patient was confused and disoriented.  No history could be obtained from him.   History was provided by girlfriend and mother at bedside.  Girlfriend stated that patient has taken 120 pills of Ritalin recently within a 4-day span.  He used heroin 2 days ago.  Family found the patient to be disoriented and confused.  He was not recognizing family members and saying things which did not make sense.  He was taken to [facility]  and a urinary catheter was placed.  Family stated that patient drinks alcohol but they were not sure when he last consumed alcohol.  Subjective: Patient's girlfriend at bedside states that he still appears to be confused.  He had difficulty passing urine yesterday and required a Foley catheter.  Patient denies any shortness of breath cough fever or chills.  Patient complains of mild redness on his lower back.  Assessment/Plan:  Principal Problem:   Acute encephalopathy Active Problems:   Rhabdomyolysis   Lactic acidosis   Hypokalemia   Leukocytosis   Metabolic encephalopathy  Heroin induced leukoencephalopathy Patient is a still disoriented and little confused.  Seen by neurology.  On CIWA protocol for alcohol abuse.  No signs of withdrawal noted.  Continue thiamine folic acid.  . Brain MRI showing symmetric abnormal diffusion restriction within the medial temporal lobes, cerebellar hemispheres, and globi pallidi in a pattern typical of heroin induced leukoencephalopathy.  EEG did not show any evidence of seizures.  We will continue supportive care.  Blanchable erythema with mild tenderness and a blister on the lower back .  Wound care was consulted.  Pressure ulceration  stage II at the gluteal fold.  Patient on admission.   Follow wound care recommendations.  Acute rhabdomyolysis Trending down CK levels today.  Continue on IV fluids.  Will monitor CK levels in a.m.  Renal function is stable so far.  Mild lactic acidosis Improved with IV fluid hydration.  Continue with IV fluids.  Acute urinary retention  And required a Foley catheter yesterday.  We will try to discontinue Foley catheter in the a.m./voiding trial.  Hypokalemia We will continue to replenish electrolytes.  We will closely monitor.  Magnesium was 1.8.  Leukocytosis -Likely reactive.    Normalized.  Alcohol abuse He is confused but no signs of withdrawal at this time except for mild hypertension.  No tachycardia..  Continue CIWA protocol.  Continue Folate, multivitamin, thiamine  Elevated transaminases -Likely related to rhabdomyolysis and alcohol abuse.  AST 360 and ALT 164 on presentation which has been improving. Alkaline phosphatase and T bili normal. Continue to monitor transaminases   VTE Prophylaxis: Lovenox  Code Status: Full code  Family Communication: Spoke with the with the patient's girlfriend member at bedside.  Disposition Plan: Home likely in 1 to 2 days.  Patient's girlfriend at bedside is concerned about his disposition and requested social services consultation.  Continue to monitor closely for mentation.  Continue to ambulate the patient today.  Foley catheter voiding trial in a.m.  Consultants:  Neurology  Procedures:  MRI of the brain, EEG   Antibiotics: Anti-infectives (From admission, onward)   None      Objective: Vitals:   05/28/18 0453 05/28/18 1022  BP: (!) 154/75 (!) 162/100  Pulse: 76 75  Resp: 15 17  Temp:  97.8 F (36.6 C)  SpO2: 100% 99%    Intake/Output Summary (Last 24 hours) at 05/28/2018 1051 Last data filed at 05/28/2018 0912 Gross per 24 hour  Intake 4722.14 ml  Output 6475 ml  Net -1752.86 ml   Filed Weights    05/26/18 2307  Weight: 86.3 kg   Body mass index is 29.8 kg/m.   Physical Exam: GENERAL: Patient is alert awake and communicative.  Oriented to time and person.  Not in obvious distress. HENT: No scleral pallor or icterus. Pupils equally reactive to light. Oral mucosa is moist NECK: is supple, no palpable thyroid enlargement. CHEST: Clear to auscultation. No crackles or wheezes. Non tender on palpation. Diminished breath sounds bilaterally. CVS: S1 and S2 heard, no murmur. Regular rate and rhythm. No pericardial rub. ABDOMEN: Soft, non-tender, bowel sounds are present. No palpable hepato-splenomegaly. EXTREMITIES: No edema. CNS: Cranial nerves are intact. No focal motor or sensory deficits. SKIN: warm and dry, mild erythema noted in the sacral gluteal region which is blanchable and mildly tender.  Data Review: I have personally reviewed the following laboratory data and studies,  CBC: Recent Labs  Lab 05/25/18 1359 05/26/18 1947 05/27/18 0603 05/28/18 0558  WBC 15.5* 14.4* 9.8 7.4  NEUTROABS 12.6* 11.8*  --   --   HGB 16.2 15.1 13.2 13.4  HCT 47.5 44.5 40.1 42.5  MCV 94.4 94.9 96.9 99.5  PLT 244 247 224 240   Basic Metabolic Panel: Recent Labs  Lab 05/25/18 1359 05/26/18 1947 05/26/18 2321 05/27/18 0603 05/27/18 2013 05/28/18 0558  NA 130* 132*  --  138 139 139  K 3.9 2.8*  --  3.4* 3.6 3.3*  CL 91* 97*  --  106 109 107  CO2 30 26  --  24 25 23   GLUCOSE 187* 167*  --  104* 97 99  BUN 31* 16  --  12 10 7   CREATININE 0.95 0.71  --  0.63 0.68 0.60*  CALCIUM 8.6* 8.5*  --  7.9* 8.3* 8.4*  MG  --   --  1.8  --  1.9 1.8  PHOS  --   --   --   --   --  2.5   Liver Function Tests: Recent Labs  Lab 05/25/18 1359 05/26/18 1947 05/27/18 0603 05/27/18 2013 05/28/18 0558  AST 427* 360* 228* 226* 149*  ALT 164* 164* 128* 135* 110*  ALKPHOS 85 86 69 97 80  BILITOT 1.2 0.9 0.8 0.4 0.6  PROT 7.2 7.4 5.9* 6.6 5.9*  ALBUMIN 3.8 3.6 2.9* 3.3* 2.8*   Recent Labs  Lab  05/25/18 1359  LIPASE 18   Recent Labs  Lab 05/25/18 1400 05/26/18 1947  AMMONIA 19 38*   Cardiac Enzymes: Recent Labs  Lab 05/26/18 1947 05/27/18 0603 05/28/18 0558  CKTOTAL 3,616* 5,849* 2,472*   BNP (last 3 results) No results for input(s): BNP in the last 8760 hours.  ProBNP (last 3 results) No results for input(s): PROBNP in the last 8760 hours.  CBG: No results for input(s): GLUCAP in the last 168 hours. No results found for this or any previous visit (from the past 240 hour(s)).   Studies: Ct Head Wo Contrast  Result Date: 05/26/2018 CLINICAL DATA:  Altered level of consciousness. EXAM: CT HEAD WITHOUT CONTRAST TECHNIQUE: Contiguous axial images were obtained from the base of the skull through the vertex without intravenous contrast. COMPARISON:  CT 02/06/2014 FINDINGS: Brain: Ventricle size normal. Symmetric hypodensities in the globus pallidus bilaterally are new since the prior CT. Ill-defined hypodensities in the cerebellum bilaterally also are new. These are likely due to ischemia, possibly acute. Negative for hemorrhage or mass. No midline shift. Vascular: Negative for hyperdense vessel Skull: Negative Sinuses/Orbits: Negative Other: None IMPRESSION: Symmetric hypodensities in the globus pallidus and cerebellum bilaterally suggestive anoxic injury, possibly acute. Carbon monoxide can cause this appearance by CT. The patient has history of recent heroin use which would appear to be a more likely etiology. MRI brain recommended to evaluate for acute infarction. These results were called by telephone at the time of interpretation on 05/26/2018 at 7:38 pm to Dr. Kristine Royal , who verbally acknowledged these results. Electronically Signed   By: Marlan Palau M.D.   On: 05/26/2018 19:38   Mr Brain Wo Contrast (neuro Protocol)  Result Date: 05/26/2018 CLINICAL DATA:  Altered mental status.  Recent heroin overdose. EXAM: MRI HEAD WITHOUT CONTRAST TECHNIQUE: Multiplanar,  multiecho pulse sequences of the brain and surrounding structures were obtained without intravenous contrast. COMPARISON:  Head CT 05/26/2018 FINDINGS: BRAIN: There is abnormal diffusion restriction within both cerebellar hemispheres, both medial temporal lobes of both basal ganglia, in a symmetric distribution. There is associated hyperintense T2-weighted signal at these locations. No acute hemorrhage or extra-axial collection. The white matter signal is normal for the patient's age. The cerebral and cerebellar volume are age-appropriate. Susceptibility-sensitive sequences show no chronic microhemorrhage or superficial siderosis. VASCULAR: Major intracranial arterial and venous sinus flow voids are normal. SKULL AND UPPER CERVICAL SPINE: Calvarial bone marrow signal is normal. There is no skull base mass. Visualized upper cervical spine and soft tissues are normal. SINUSES/ORBITS: No fluid levels or advanced mucosal thickening. No mastoid or middle ear effusion. The orbits are normal. IMPRESSION: Symmetric, abnormal diffusion restriction within the medial temporal lobes, cerebellar hemispheres and globi pallidi in a pattern typical of heroin induced leukoencephalopathy. Electronically Signed   By: Deatra Robinson M.D.   On: 05/26/2018 21:53    Scheduled Meds: . docusate sodium  100 mg Oral BID  . enoxaparin (LOVENOX) injection  40 mg Subcutaneous QHS  . folic acid  1 mg Oral Daily  . multivitamin with minerals  1 tablet Oral Daily  . polyethylene glycol  17 g Oral Daily  . thiamine  100 mg Oral Daily   Or  . thiamine  100 mg Intravenous Daily    Continuous Infusions: . sodium chloride 200 mL/hr at 05/28/18 0545     Joycelyn Das, MD  [facility] 05/28/2018

## 2018-05-29 DIAGNOSIS — L89102 Pressure ulcer of unspecified part of back, stage 2: Secondary | ICD-10-CM

## 2018-05-29 LAB — COMPREHENSIVE METABOLIC PANEL
ALK PHOS: 81 U/L (ref 38–126)
ALT: 96 U/L — ABNORMAL HIGH (ref 0–44)
ANION GAP: 12 (ref 5–15)
AST: 85 U/L — ABNORMAL HIGH (ref 15–41)
Albumin: 2.9 g/dL — ABNORMAL LOW (ref 3.5–5.0)
BUN: 8 mg/dL (ref 6–20)
CO2: 23 mmol/L (ref 22–32)
Calcium: 8.7 mg/dL — ABNORMAL LOW (ref 8.9–10.3)
Chloride: 104 mmol/L (ref 98–111)
Creatinine, Ser: 0.58 mg/dL — ABNORMAL LOW (ref 0.61–1.24)
GFR calc Af Amer: >60 mL/min (ref >60)
GFR calc non Af Amer: >60 mL/min (ref >60)
Glucose, Bld: 97 mg/dL (ref 70–99)
Potassium: 3.6 mmol/L (ref 3.5–5.1)
Sodium: 139 mmol/L (ref 135–145)
Total Bilirubin: 0.8 mg/dL (ref 0.3–1.2)
Total Protein: 6.2 g/dL — ABNORMAL LOW (ref 6.5–8.1)

## 2018-05-29 LAB — CBC
HCT: 42.9 % (ref 39.0–52.0)
Hemoglobin: 13.6 g/dL (ref 13.0–17.0)
MCH: 31.2 pg (ref 26.0–34.0)
MCHC: 31.7 g/dL (ref 30.0–36.0)
MCV: 98.4 fL (ref 80.0–100.0)
Platelets: 276 10*3/uL (ref 150–400)
RBC: 4.36 MIL/uL (ref 4.22–5.81)
RDW: 12.6 % (ref 11.5–15.5)
WBC: 8.6 10*3/uL (ref 4.0–10.5)
nRBC: 0 % (ref 0.0–0.2)

## 2018-05-29 LAB — CK: Total CK: 811 U/L — ABNORMAL HIGH (ref 49–397)

## 2018-05-29 LAB — MAGNESIUM: Magnesium: 1.8 mg/dL (ref 1.7–2.4)

## 2018-05-29 MED ORDER — METHYLPHENIDATE HCL 10 MG PO TABS: 20.0000 mg | ORAL_TABLET | Freq: Two times a day (BID) | ORAL | 0 refills | Status: AC

## 2018-05-29 MED ORDER — FOLIC ACID 1 MG PO TABS: 1.0000 mg | ORAL_TABLET | Freq: Every day | ORAL | 0 refills | Status: AC

## 2018-05-29 MED ORDER — THIAMINE HCL 100 MG PO TABS: 100.0000 mg | ORAL_TABLET | Freq: Every day | ORAL | 0 refills | Status: AC

## 2018-05-29 MED ORDER — POTASSIUM CHLORIDE CRYS ER 20 MEQ PO TBCR
40.0000 meq | EXTENDED_RELEASE_TABLET | Freq: Two times a day (BID) | ORAL | Status: DC
  Administered 2018-05-29: 40 meq via ORAL
  Filled 2018-05-29: qty 2

## 2018-05-29 MED ORDER — NICOTINE 21 MG/24HR TD PT24
21.0000 mg | MEDICATED_PATCH | Freq: Every day | TRANSDERMAL | Status: DC
  Administered 2018-05-29: 21 mg via TRANSDERMAL
  Filled 2018-05-29: qty 1

## 2018-05-29 MED ORDER — MAGNESIUM OXIDE 400 (241.3 MG) MG PO TABS: 400.0000 mg | ORAL_TABLET | Freq: Two times a day (BID) | ORAL | 0 refills | Status: AC

## 2018-05-29 NOTE — Progress Notes (Signed)
PROGRESS NOTE  Dennis Tyler BTD:[id number] DOB: 11-16-1970 DOA: 05/26/2018 PCP: Patient, No Pcp Per   LOS: 2 days   Brief narrative:  Dennis Tyler is a 48 y.o. male with medical history significant of polysubstance abuse presented to the hospital for evaluation of altered mental status.  Patient was confused and disoriented.  No history could be obtained from him.   History was provided by girlfriend and mother at bedside.  Girlfriend stated that patient has taken 120 pills of Ritalin recently within a 4-day span.  He used heroin 2 days ago.  Family found the patient to be disoriented and confused.  He was not recognizing family members and saying things which did not make sense.  He was taken to [facility]  and a urinary catheter was placed.  Family stated that patient drinks alcohol but they were not sure when he last consumed alcohol.  Subjective: Patient's mother at bedside.  Patient's mother is concerned about mental status.  Patient required Foley catheter for urinary retention.  He got little diaphoretic and confused yesterday and required Ativan.  Overnight patient has slept well.   Assessment/Plan:  Principal Problem:   Acute encephalopathy Active Problems:   Rhabdomyolysis   Lactic acidosis   Hypokalemia   Leukocytosis   Metabolic encephalopathy   Pressure injury of skin  Heroin induced leukoencephalopathy Patient was little diaphoretic confused and agitated yesterday.  Received Ativan.  Overnight patient has slept well.  Seems to be slightly confused but improved compared to yesterday.  Seen by neurology.  On CIWA protocol for alcohol abuse.  No signs of withdrawal noted.  Continue thiamine, folic acid.  . Brain MRI showing symmetric abnormal diffusion restriction within the medial temporal lobes, cerebellar hemispheres, and globi pallidi in a pattern typical of heroin induced leukoencephalopathy.  EEG did not show any evidence of seizures.  We will continue  supportive care.  Pressure ulceration on the gluteal fold.  Present on admission.  Stage II.  Wound care on board.    Acute rhabdomyolysis CK continues to trend down with IV fluids.  Continue on IV fluids.  Will monitor CK levels in a.m.  Renal function is stable so far.  Decrease IV fluids today.  Mild lactic acidosis Improved with IV fluid hydration.  Continue with IV fluids.  Acute urinary retention  Patient required a Foley catheter yesterday.  Will consider voiding trial today.  Hypokalemia Improving.  We will continue to replenish orally.  We will continue to replenish electrolytes.    Magnesium was 1.8.  Leukocytosis -Likely reactive.    Normalized.  Alcohol abuse He is confused but no signs of withdrawal at this time. Continue CIWA protocol.  Continue Folate, multivitamin, thiamine  Elevated transaminases -Likely related to rhabdomyolysis and alcohol abuse.  AST 360 and ALT 164 on presentation which has been improving.   VTE Prophylaxis: Lovenox  Code Status: Full code  Family Communication: Spoke with the with the patient's mother at bedside.  Disposition Plan: Home likely in 1 to 2 days.  Voiding trial.  Will consult psychiatry due to his history of ADHD, heroine abuse alcohol abuse.  Consultants:  Neurology, consult psychiatry  Procedures:  MRI of the brain, EEG   Antibiotics: Anti-infectives (From admission, onward)   None      Objective: Vitals:   05/28/18 2054 05/29/18 0458  BP: (!) 143/85 (!) 149/87  Pulse: 79 (!) 58  Resp: 15 16  Temp: 98.4 F (36.9 C) 98.1 F (36.7 C)  SpO2:  97% 97%    Intake/Output Summary (Last 24 hours) at 05/29/2018 1104 Last data filed at 05/29/2018 1040 Gross per 24 hour  Intake 3358.4 ml  Output 5650 ml  Net -2291.6 ml   Filed Weights   05/26/18 2307  Weight: 86.3 kg   Body mass index is 29.8 kg/m.   Physical Exam: General:  Average built, not in obvious distress.  Alert awake and communicative HENT:  Normocephalic, pupils equally reacting to light and accommodation.  No scleral pallor or icterus noted. Oral mucosa is moist.  Chest:  Clear breath sounds.  Diminished breath sounds bilaterally. No crackles or wheezes.  CVS: S1 &S2 heard. No murmur.  Regular rate and rhythm. Abdomen: Soft, nontender, nondistended.  Bowel sounds are heard.  Liver is not palpable, no abdominal mass palpated Extremities: No cyanosis, clubbing or edema.  Peripheral pulses are palpable. Psych: Alert, awake and oriented, normal mood CNS:  No cranial nerve deficits.  Power equal in all extremities.  No sensory deficits noted.  No cerebellar signs.   Skin:  mild erythema noted in the sacral gluteal region which is blanchable and mildly tender, present on admission.  Data Review: I have personally reviewed the following laboratory data and studies,  CBC: Recent Labs  Lab 05/25/18 1359 05/26/18 1947 05/27/18 0603 05/28/18 0558 05/29/18 0531  WBC 15.5* 14.4* 9.8 7.4 8.6  NEUTROABS 12.6* 11.8*  --   --   --   HGB 16.2 15.1 13.2 13.4 13.6  HCT 47.5 44.5 40.1 42.5 42.9  MCV 94.4 94.9 96.9 99.5 98.4  PLT 244 247 224 240 276   Basic Metabolic Panel: Recent Labs  Lab 05/26/18 1947 05/26/18 2321 05/27/18 0603 05/27/18 2013 05/28/18 0558 05/29/18 0531  NA 132*  --  138 139 139 139  K 2.8*  --  3.4* 3.6 3.3* 3.6  CL 97*  --  106 109 107 104  CO2 26  --  24 25 23 23   GLUCOSE 167*  --  104* 97 99 97  BUN 16  --  12 10 7 8   CREATININE 0.71  --  0.63 0.68 0.60* 0.58*  CALCIUM 8.5*  --  7.9* 8.3* 8.4* 8.7*  MG  --  1.8  --  1.9 1.8 1.8  PHOS  --   --   --   --  2.5  --    Liver Function Tests: Recent Labs  Lab 05/26/18 1947 05/27/18 0603 05/27/18 2013 05/28/18 0558 05/29/18 0531  AST 360* 228* 226* 149* 85*  ALT 164* 128* 135* 110* 96*  ALKPHOS 86 69 97 80 81  BILITOT 0.9 0.8 0.4 0.6 0.8  PROT 7.4 5.9* 6.6 5.9* 6.2*  ALBUMIN 3.6 2.9* 3.3* 2.8* 2.9*   Recent Labs  Lab 05/25/18 1359  LIPASE 18    Recent Labs  Lab 05/25/18 1400 05/26/18 1947  AMMONIA 19 38*   Cardiac Enzymes: Recent Labs  Lab 05/26/18 1947 05/27/18 0603 05/28/18 0558 05/29/18 0531  CKTOTAL 3,616* 5,849* 2,472* 811*   BNP (last 3 results) No results for input(s): BNP in the last 8760 hours.  ProBNP (last 3 results) No results for input(s): PROBNP in the last 8760 hours.  CBG: No results for input(s): GLUCAP in the last 168 hours. Recent Results (from the past 240 hour(s))  Culture, Urine     Status: None   Collection Time: 05/26/18 10:35 PM  Result Value Ref Range Status   Specimen Description   Final    URINE, CLEAN CATCH  Performed at [facility], [address] [place], [place] [address]    Special Requests   Final    NONE Performed at [facility], [address], [place], [place] [address]    Culture   Final    NO GROWTH Performed at [facility], [address] [place], [place] [address]    Report Status 05/28/2018 FINAL  Final     Studies: No results found.  Scheduled Meds:  docusate sodium  100 mg Oral BID   enoxaparin (LOVENOX) injection  40 mg Subcutaneous QHS   folic acid  1 mg Oral Daily   magnesium oxide  400 mg Oral BID   multivitamin with minerals  1 tablet Oral Daily   polyethylene glycol  17 g Oral Daily   thiamine  100 mg Oral Daily   Or   thiamine  100 mg Intravenous Daily    Continuous Infusions:  sodium chloride 150 mL/hr at 05/29/18 0727     Joycelyn Das, MD  [facility] 05/29/2018

## 2018-05-29 NOTE — Progress Notes (Signed)
Pt D/C'd home with family- MD Pokhrel aware of psychiatrist's recommendation prior to D/C- gave VO to continue with D/C as previously ordered. No changes made to plan of care.

## 2018-05-29 NOTE — Consult Note (Signed)
[facility] Face-to-Face Psychiatry Consult   Reason for Consult: ''abnormal behavior, drug abuse, Ritalin OD'' Referring Physician:  Dr. Rebekah Chesterfield Patient Identification: Dennis Tyler MRN:  [id number] Principal Diagnosis: Acute encephalopathy Diagnosis:  Principal Problem:   Acute encephalopathy Active Problems:   Rhabdomyolysis   Lactic acidosis   Hypokalemia   Leukocytosis   Metabolic encephalopathy   Pressure injury of skin   Total Time spent with patient: 45 minutes  Subjective:   Dennis Tyler is a 48 y.o. male patient admitted after he overdosed on 120 tablets of 10 mg  Ritalin.  HPI:  Patient with long history of Polysubstance dependence, ADHD who was admitted to the hospital due to altered mental status after he overdosed on 120 tablets of 10 mg Ritalin which he took within 4-day span. Currently, patient is struggling with remembering stuffs and unable to remember the sequence of events regarding his current admission. He was interviewed in the presence of his girlfriend and mother who reports that he has a long history of drug abuse and legal issues. Today, patient is alert, awake, calm and denies psychosis, delusions, depression, suicidal ideations, intent or plan. Patient is not looking for detoxication but he is concerned about his memory.  Past Psychiatric History: as above  Risk to Self:  denies Risk to Others:  denies Prior Inpatient Therapy:   Prior Outpatient Therapy:  PCP  Past Medical History: History reviewed. No pertinent past medical history.  Past Surgical History:  Procedure Laterality Date  . APPENDECTOMY    . KNEE SURGERY     Family History:  Family History  Problem Relation Age of Onset  . Cancer Other   . Stroke Other    Family Psychiatric  History: AS ABOVE Social History:  Social History   Substance and Sexual Activity  Alcohol Use Yes  . Alcohol/week: 84.0 standard drinks  . Types: 84 Cans of beer per week     Social History   Substance  and Sexual Activity  Drug Use Yes  . Types: Marijuana, IV   Comment: heroin     Social History   Socioeconomic History  . Marital status: Divorced    Spouse name: Not on file  . Number of children: Not on file  . Years of education: Not on file  . Highest education level: Not on file  Occupational History  . Not on file  Social Needs  . Financial resource strain: Not on file  . Food insecurity:    Worry: Not on file    Inability: Not on file  . Transportation needs:    Medical: Not on file    Non-medical: Not on file  Tobacco Use  . Smoking status: Current Every Day Smoker    Packs/day: 1.00    Years: 20.00    Pack years: 20.00    Types: Cigarettes  . Smokeless tobacco: Never Used  Substance and Sexual Activity  . Alcohol use: Yes    Alcohol/week: 84.0 standard drinks    Types: 84 Cans of beer per week  . Drug use: Yes    Types: Marijuana, IV    Comment: heroin   . Sexual activity: Not on file  Lifestyle  . Physical activity:    Days per week: Not on file    Minutes per session: Not on file  . Stress: Not on file  Relationships  . Social connections:    Talks on phone: Not on file    Gets together: Not on file    Attends  religious service: Not on file    Active member of club or organization: Not on file    Attends meetings of clubs or organizations: Not on file    Relationship status: Not on file  Other Topics Concern  . Not on file  Social History Narrative  . Not on file   Additional Social History:    Allergies:  No Known Allergies  Labs:  Results for orders placed or performed during the hospital encounter of 05/26/18 (from the past 48 hour(s))  Comprehensive metabolic panel     Status: Abnormal   Collection Time: 05/27/18  8:13 PM  Result Value Ref Range   Sodium 139 135 - 145 mmol/L   Potassium 3.6 3.5 - 5.1 mmol/L   Chloride 109 98 - 111 mmol/L   CO2 25 22 - 32 mmol/L   Glucose, Bld 97 70 - 99 mg/dL   BUN 10 6 - 20 mg/dL   Creatinine,  Ser 6.38 0.61 - 1.24 mg/dL   Calcium 8.3 (L) 8.9 - 10.3 mg/dL   Total Protein 6.6 6.5 - 8.1 g/dL   Albumin 3.3 (L) 3.5 - 5.0 g/dL   AST 756 (H) 15 - 41 U/L   ALT 135 (H) 0 - 44 U/L   Alkaline Phosphatase 97 38 - 126 U/L   Total Bilirubin 0.4 0.3 - 1.2 mg/dL   GFR calc non Af Amer >60 >60 mL/min   GFR calc Af Amer >60 >60 mL/min   Anion gap 5 5 - 15    Comment: Performed at [facility], [address] [place], [place] [address]  Magnesium     Status: None   Collection Time: 05/27/18  8:13 PM  Result Value Ref Range   Magnesium 1.9 1.7 - 2.4 mg/dL    Comment: Performed at [facility], [address], [place], [place] [address]  Comprehensive metabolic panel     Status: Abnormal   Collection Time: 05/28/18  5:58 AM  Result Value Ref Range   Sodium 139 135 - 145 mmol/L   Potassium 3.3 (L) 3.5 - 5.1 mmol/L   Chloride 107 98 - 111 mmol/L   CO2 23 22 - 32 mmol/L   Glucose, Bld 99 70 - 99 mg/dL   BUN 7 6 - 20 mg/dL   Creatinine, Ser 1.66 (L) 0.61 - 1.24 mg/dL   Calcium 8.4 (L) 8.9 - 10.3 mg/dL   Total Protein 5.9 (L) 6.5 - 8.1 g/dL   Albumin 2.8 (L) 3.5 - 5.0 g/dL   AST 063 (H) 15 - 41 U/L   ALT 110 (H) 0 - 44 U/L   Alkaline Phosphatase 80 38 - 126 U/L   Total Bilirubin 0.6 0.3 - 1.2 mg/dL   GFR calc non Af Amer >60 >60 mL/min   GFR calc Af Amer >60 >60 mL/min   Anion gap 9 5 - 15    Comment: Performed at [facility], [address], [place], [place] [address]  CK     Status: Abnormal   Collection Time: 05/28/18  5:58 AM  Result Value Ref Range   Total CK 2,472 (H) 49 - 397 U/L    Comment: Performed at [facility], [address] [place], [place] [address]  Magnesium     Status: None   Collection Time: 05/28/18  5:58 AM  Result Value Ref Range   Magnesium 1.8 1.7 - 2.4 mg/dL    Comment: Performed at [facility], [address] [place], [place] [address]  CBC     Status: None    Collection Time: 05/28/18  5:58 AM  Result Value Ref Range   WBC 7.4 4.0 - 10.5 K/uL   RBC 4.27 4.22 - 5.81 MIL/uL   Hemoglobin 13.4 13.0 - 17.0 g/dL   HCT 24.5 80.9 - 98.3 %   MCV 99.5 80.0 - 100.0 fL   MCH 31.4 26.0 - 34.0 pg   MCHC 31.5 30.0 - 36.0 g/dL   RDW 38.2 50.5 - 39.7 %   Platelets 240 150 - 400 K/uL   nRBC 0.0 0.0 - 0.2 %    Comment: Performed at [facility], [address], [place], [place] [address]  Phosphorus     Status: None   Collection Time: 05/28/18  5:58 AM  Result Value Ref Range   Phosphorus 2.5 2.5 - 4.6 mg/dL    Comment: Performed at [facility], [address], [place], [place] [address]  CBC     Status: None   Collection Time: 05/29/18  5:31 AM  Result Value Ref Range   WBC 8.6 4.0 - 10.5 K/uL   RBC 4.36 4.22 - 5.81 MIL/uL   Hemoglobin 13.6 13.0 - 17.0 g/dL   HCT 24.0 97.3 - 53.2 %   MCV 98.4 80.0 - 100.0 fL   MCH 31.2 26.0 - 34.0 pg   MCHC 31.7 30.0 - 36.0 g/dL   RDW 99.2 42.6 - 83.4 %   Platelets 276 150 - 400 K/uL   nRBC 0.0 0.0 - 0.2 %    Comment: Performed at [facility], [address], [place], [place] [address]  Comprehensive metabolic panel     Status: Abnormal   Collection Time: 05/29/18  5:31 AM  Result Value Ref Range   Sodium 139 135 - 145 mmol/L   Potassium 3.6 3.5 - 5.1 mmol/L   Chloride 104 98 - 111 mmol/L   CO2 23 22 - 32 mmol/L   Glucose, Bld 97 70 - 99 mg/dL   BUN 8 6 - 20 mg/dL   Creatinine, Ser 2.97 (L) 0.61 - 1.24 mg/dL   Calcium 8.7 (L) 8.9 - 10.3 mg/dL   Total Protein 6.2 (L) 6.5 - 8.1 g/dL   Albumin 2.9 (L) 3.5 - 5.0 g/dL   AST 85 (H) 15 - 41 U/L   ALT 96 (H) 0 - 44 U/L   Alkaline Phosphatase 81 38 - 126 U/L   Total Bilirubin 0.8 0.3 - 1.2 mg/dL   GFR calc non Af Amer >60 >60 mL/min   GFR calc Af Amer >60 >60 mL/min   Anion gap 12 5 - 15    Comment: Performed at [facility], [address], [place], [place] [address]  CK      Status: Abnormal   Collection Time: 05/29/18  5:31 AM  Result Value Ref Range   Total CK 811 (H) 49 - 397 U/L    Comment: Performed at [facility], [address], [place], [place] [address]  Magnesium     Status: None   Collection Time: 05/29/18  5:31 AM  Result Value Ref Range   Magnesium 1.8 1.7 - 2.4 mg/dL    Comment: Performed at [facility], [address], [place], [place] [address]    Current Facility-Administered Medications  Medication Dose Route Frequency Provider Last Rate Last Dose  . 0.9 %  sodium chloride infusion  Intravenous Continuous Pokhrel, Laxman, MD 150 mL/hr at 05/29/18 0727    . acetaminophen (TYLENOL) tablet 650 mg  650 mg Oral Q6H PRN John Giovanni, MD   650 mg at 05/29/18 1104   Or  . acetaminophen (TYLENOL) suppository 650 mg  650 mg Rectal Q6H PRN John Giovanni, MD      . docusate sodium (COLACE) capsule 100 mg  100 mg Oral BID Pokhrel, Laxman, MD   100 mg at 05/29/18 1019  . enoxaparin (LOVENOX) injection 40 mg  40 mg Subcutaneous QHS John Giovanni, MD   40 mg at 05/28/18 2158  . folic acid (FOLVITE) tablet 1 mg  1 mg Oral Daily John Giovanni, MD   1 mg at 05/29/18 1019  . liver oil-zinc oxide (DESITIN) 40 % ointment   Topical BID PRN Pokhrel, Laxman, MD      . LORazepam (ATIVAN) injection 2-3 mg  2-3 mg Intravenous Q1H PRN Drema Dallas, MD   3 mg at 05/28/18 1337  . magnesium oxide (MAG-OX) tablet 400 mg  400 mg Oral BID Pokhrel, Laxman, MD   400 mg at 05/29/18 1019  . multivitamin with minerals tablet 1 tablet  1 tablet Oral Daily John Giovanni, MD   1 tablet at 05/29/18 1019  . nicotine (NICODERM CQ - dosed in mg/24 hours) patch 21 mg  21 mg Transdermal Daily Pokhrel, Laxman, MD   21 mg at 05/29/18 1157  . polyethylene glycol (MIRALAX / GLYCOLAX) packet 17 g  17 g Oral Daily Pokhrel, Laxman, MD   17 g at 05/29/18 1022  . potassium chloride SA (K-DUR,KLOR-CON) CR tablet 40 mEq  40 mEq Oral  BID Pokhrel, Laxman, MD   40 mEq at 05/29/18 1157  . thiamine (VITAMIN B-1) tablet 100 mg  100 mg Oral Daily John Giovanni, MD   100 mg at 05/29/18 1019   Or  . thiamine (B-1) injection 100 mg  100 mg Intravenous Daily John Giovanni, MD        Musculoskeletal: Strength & Muscle Tone: within normal limits Gait & Station: not tested Patient leans: N/A  Psychiatric Specialty Exam: Physical Exam  Psychiatric: His speech is normal and behavior is normal. Thought content normal. His mood appears anxious. Cognition and memory are normal. He expresses impulsivity.    Review of Systems  Constitutional: Negative.   HENT: Negative.   Eyes: Negative.   Respiratory: Negative.   Cardiovascular: Negative.   Gastrointestinal: Negative.   Genitourinary: Negative.   Musculoskeletal: Negative.   Skin: Negative.   Neurological: Negative.   Endo/Heme/Allergies: Negative.   Psychiatric/Behavioral: Positive for memory loss.    Blood pressure (!) 149/87, pulse (!) 58, temperature 98.1 F (36.7 C), temperature source Oral, resp. rate 16, height 5\' 7"  (1.702 m), weight 86.3 kg, SpO2 97 %.Body mass index is 29.8 kg/m.  General Appearance: Casual  Eye Contact:  Fair  Speech:  Clear and Coherent  Volume:  Normal  Mood:  Euthymic  Affect:  Constricted  Thought Process:  Disorganized  Orientation:  Other:  to place and person  Thought Content:  Logical  Suicidal Thoughts:  No  Homicidal Thoughts:  No  Memory:  Immediate;   Fair Recent;   Fair Remote;   Poor  Judgement:  Intact  Insight:  Fair  Psychomotor Activity:  Normal  Concentration:  Concentration: Fair and Attention Span: Fair  Recall:  [employer] of Knowledge:  Fair  Language:  Good  Akathisia:  No  Handed:  Right  AIMS (if indicated):     Assets:  Communication Skills Desire for Improvement  ADL's:  Intact  Cognition:  Impaired,  Mild  Sleep:   fair     Treatment Plan Summary: 48 y.o.malewith medical history  significant ofPolysubstance dependence who was admitted with altered mental status following an overdose on Ritalin. Patient denies acute psychiatric symptoms but reports memory loss and inability to recall past events.   Recommendations: -Consider referring patient to a Neurologist for memory evaluation upon discharge.  -Patient will also benefit from cognitive testing by a Neuropsychologist. -Discontinue Ritalin: Patient with polysubstance dependence. -Refer patient  to a psychiatrist for ADHD medication management upon discharge.  Disposition: No evidence of imminent risk to self or others at present.   Patient does not meet criteria for psychiatric inpatient admission. Supportive therapy provided about ongoing stressors. Psychiatric service signing out. Re-consult as needed.  Thedore Mins, MD 05/29/2018 1:25 PM

## 2018-05-29 NOTE — Discharge Summary (Signed)
Physician Discharge Summary  Dennis Tyler XYB:[id number] DOB: 29-Jun-1970 DOA: 05/26/2018  PCP: Patient, No Pcp Per  Admit date: 05/26/2018 Discharge date: 05/29/2018  Admitted From: Home  Discharge disposition: Home   Recommendations for Outpatient Follow-Up:   Follow up with your primary care provider in one week.    Discharge Diagnosis:   Principal Problem:   Acute encephalopathy Active Problems:   Rhabdomyolysis   Lactic acidosis   Hypokalemia   Leukocytosis   Metabolic encephalopathy   Pressure injury of skin   Discharge Condition: Improved.  Diet recommendation: Low sodium, heart healthy.    Wound care: None.  Code status: Full.   History of Present Illness:   Dennis Tyler a 48 y.o.malewith medical history significant ofpolysubstance abuse presented to the hospital for evaluation of altered mental status. Patient was confused and disoriented. No history could be obtained from him. History was provided by girlfriend and mother at bedside. Girlfriend stated that patient has taken 120 pills of Ritalin recently within a 4-day span. He used heroin 2 days ago. Family found the patient to be disoriented and confused. He was not recognizing family membersand saying things which did not make sense. He was taken to [facility]  and a urinary catheter was placed. Family stated that patient drinks alcohol but they were not sure when he last consumed alcohol.   Hospital Course:  Patient was admitted to hospital in following conditions were addressed during hospitalization.  Heroin induced leukoencephalopathy Patient had mild confusion during hospital stay.  Patient was put on CIWA protocol and was continued on thiamine and folic acid. Brain MRI showing symmetric abnormal diffusion restriction within the medial temporal lobes, cerebellar hemispheres, and globi pallidi inapattern typical of heroin induced leukoencephalopathy.  EEG did not show any  evidence of seizures.    Neurology recommended supportive care.  Patient was extensively counseled regarding heroin abuse  Pressure ulceration on the gluteal fold.  Present on admission.  Stage II.  Wound care followed the patient during hospitalization.  Acute rhabdomyolysis Patient received IV fluids with significant improvement in rhabdomyolysis.  mild lactic acidosis Improved with IV fluid hydration.  Acute urinary retention  Patient required a Foley catheter which was subsequently removed and patient was able to void by himself.  Hypokalemia Was replenished.  Leukocytosis -Likelyreactive.   Normalized.  Alcohol abuse He did not have signs of withdrawal.  Was continued on Folate, multivitamin, thiamine  Elevated transaminases -Likely related to rhabdomyolysis and alcohol abuse. improved  Disposition. At this time, patient is stable for disposition home.  He was advised to follow-up with his psychiatrist/primary care physician as outpatient. I also spoke with the patient's family regarding the patient's disposition.   Medical Consultants:   psychiatry  Subjective:   Patient insisted on being discharged home.  He was seen by psychiatry.   Discharge Exam:   Vitals:   05/28/18 2054 05/29/18 0458  BP: (!) 143/85 (!) 149/87  Pulse: 79 (!) 58  Resp: 15 16  Temp: 98.4 F (36.9 C) 98.1 F (36.7 C)  SpO2: 97% 97%   Vitals:   05/28/18 1637 05/28/18 1905 05/28/18 2054 05/29/18 0458  BP: 130/88 132/84 (!) 143/85 (!) 149/87  Pulse:  78 79 (!) 58  Resp:  (!) 21 15 16   Temp:  98.6 F (37 C) 98.4 F (36.9 C) 98.1 F (36.7 C)  TempSrc:  Axillary Oral Oral  SpO2:   97% 97%  Weight:      Height:  General exam: Appears calm and comfortable ,Not in distress HEENT:PERRL,Oral mucosa moist Respiratory system: Bilateral equal air entry, normal vesicular breath sounds, no wheezes or crackles  Cardiovascular system: S1 & S2 heard, RRR.  Gastrointestinal  system: Abdomen is nondistended, soft and nontender. No organomegaly or masses felt. Normal bowel sounds heard. Central nervous system: Alert and oriented. No focal neurological deficits. Extremities: No edema, no clubbing ,no cyanosis, distal peripheral pulses palpable. Skin: Mild erythema noted in the sacral gluteal region MSK: Normal muscle bulk,tone ,power   Procedures:     MRI of the brain, EEG  The results of significant diagnostics from this hospitalization (including imaging, microbiology, ancillary and laboratory) are listed below for reference.     Diagnostic Studies:   No results found.   Labs:   Basic Metabolic Panel: Recent Labs  Lab 05/26/18 1947 05/26/18 2321 05/27/18 0603 05/27/18 2013 05/28/18 0558 05/29/18 0531  NA 132*  --  138 139 139 139  K 2.8*  --  3.4* 3.6 3.3* 3.6  CL 97*  --  106 109 107 104  CO2 26  --  24 25 23 23   GLUCOSE 167*  --  104* 97 99 97  BUN 16  --  12 10 7 8   CREATININE 0.71  --  0.63 0.68 0.60* 0.58*  CALCIUM 8.5*  --  7.9* 8.3* 8.4* 8.7*  MG  --  1.8  --  1.9 1.8 1.8  PHOS  --   --   --   --  2.5  --    GFR Estimated Creatinine Clearance: 121.1 mL/min (A) (by C-G formula based on SCr of 0.58 mg/dL (L)). Liver Function Tests: Recent Labs  Lab 05/26/18 1947 05/27/18 0603 05/27/18 2013 05/28/18 0558 05/29/18 0531  AST 360* 228* 226* 149* 85*  ALT 164* 128* 135* 110* 96*  ALKPHOS 86 69 97 80 81  BILITOT 0.9 0.8 0.4 0.6 0.8  PROT 7.4 5.9* 6.6 5.9* 6.2*  ALBUMIN 3.6 2.9* 3.3* 2.8* 2.9*   Recent Labs  Lab 05/25/18 1359  LIPASE 18   Recent Labs  Lab 05/25/18 1400 05/26/18 1947  AMMONIA 19 38*   Coagulation profile No results for input(s): INR, PROTIME in the last 168 hours.  CBC: Recent Labs  Lab 05/25/18 1359 05/26/18 1947 05/27/18 0603 05/28/18 0558 05/29/18 0531  WBC 15.5* 14.4* 9.8 7.4 8.6  NEUTROABS 12.6* 11.8*  --   --   --   HGB 16.2 15.1 13.2 13.4 13.6  HCT 47.5 44.5 40.1 42.5 42.9  MCV 94.4  94.9 96.9 99.5 98.4  PLT 244 247 224 240 276   Cardiac Enzymes: Recent Labs  Lab 05/26/18 1947 05/27/18 0603 05/28/18 0558 05/29/18 0531  CKTOTAL 3,616* 5,849* 2,472* 811*   BNP: Invalid input(s): POCBNP CBG: No results for input(s): GLUCAP in the last 168 hours. D-Dimer No results for input(s): DDIMER in the last 72 hours. Hgb A1c No results for input(s): HGBA1C in the last 72 hours. Lipid Profile No results for input(s): CHOL, HDL, LDLCALC, TRIG, CHOLHDL, LDLDIRECT in the last 72 hours. Thyroid function studies No results for input(s): TSH, T4TOTAL, T3FREE, THYROIDAB in the last 72 hours.  Invalid input(s): FREET3 Anemia work up No results for input(s): VITAMINB12, FOLATE, FERRITIN, TIBC, IRON, RETICCTPCT in the last 72 hours. Microbiology Recent Results (from the past 240 hour(s))  Culture, Urine     Status: None   Collection Time: 05/26/18 10:35 PM  Result Value Ref Range Status   Specimen Description  Final    URINE, CLEAN CATCH Performed at [facility], [address] [place], [place] [address]    Special Requests   Final    NONE Performed at [facility], [address] [place], [place] [address]    Culture   Final    NO GROWTH Performed at [facility], [address] [place], [place] [address]    Report Status 05/28/2018 FINAL  Final     Discharge Instructions:   Discharge Instructions    Diet general   Complete by:  As directed    Discharge instructions   Complete by:  As directed    Follow-up with your primary care physician in 1 week.  No smoking.  Please seek help with rehabilitation as outpatient   Increase activity slowly   Complete by:  As directed      Allergies as of 05/29/2018   No Known Allergies     Medication List    TAKE these medications   acetaminophen 500 MG tablet Commonly known as:  TYLENOL Take 1,000 mg by mouth daily as needed for fever.   amLODipine 10 MG tablet  Commonly known as:  NORVASC Take 10 mg by mouth daily.   aspirin-sod bicarb-citric acid 325 MG Tbef tablet Commonly known as:  ALKA-SELTZER Take 325 mg by mouth daily as needed (indigestion).   Desitin 13 % Crea Generic drug:  Zinc Oxide Apply 1 application topically daily as needed (bed sore).   folic acid 1 MG tablet Commonly known as:  FOLVITE Take 1 tablet (1 mg total) by mouth daily. Start taking on:  May 30, 2018   labetalol 200 MG tablet Commonly known as:  NORMODYNE Take 200 mg by mouth 2 (two) times daily.   magnesium oxide 400 (241.3 Mg) MG tablet Commonly known as:  MAG-OX Take 1 tablet (400 mg total) by mouth 2 (two) times daily.   methylphenidate 10 MG tablet Commonly known as:  RITALIN Take 2 tablets (20 mg total) by mouth 2 (two) times daily.   permethrin 5 % cream Commonly known as:  ELIMITE Apply to affected area once, leaving on for 10 hours, then shower.   thiamine 100 MG tablet Take 1 tablet (100 mg total) by mouth daily. Start taking on:  May 30, 2018       Time coordinating discharge: 39 minutes  Signed:  Fusae Florio  [facility] 05/29/2018, 12:50 PM

## 2018-06-05 ENCOUNTER — Encounter (HOSPITAL_COMMUNITY): Payer: Self-pay

## 2018-06-05 ENCOUNTER — Other Ambulatory Visit: Payer: Self-pay

## 2018-06-05 ENCOUNTER — Emergency Department (HOSPITAL_COMMUNITY)
Admission: EM | Admit: 2018-06-05 | Discharge: 2018-06-05 | Disposition: A | Discharge status: Home / Self Care | Payer: Self-pay | Attending: Emergency Medicine | Admitting: Emergency Medicine

## 2018-06-05 DIAGNOSIS — N3000 Acute cystitis without hematuria: Secondary | ICD-10-CM | POA: Insufficient documentation

## 2018-06-05 DIAGNOSIS — F17210 Nicotine dependence, cigarettes, uncomplicated: Secondary | ICD-10-CM | POA: Insufficient documentation

## 2018-06-05 DIAGNOSIS — F1193 Opioid use, unspecified with withdrawal: Secondary | ICD-10-CM

## 2018-06-05 DIAGNOSIS — F1123 Opioid dependence with withdrawal: Secondary | ICD-10-CM | POA: Insufficient documentation

## 2018-06-05 LAB — CBC WITH DIFFERENTIAL/PLATELET
Abs Immature Granulocytes: 0.08 10*3/uL — ABNORMAL HIGH (ref 0.00–0.07)
Basophils Absolute: 0.1 10*3/uL (ref 0.0–0.1)
Basophils Relative: 0 %
EOS PCT: 0 %
Eosinophils Absolute: 0 10*3/uL (ref 0.0–0.5)
HCT: 44.9 % (ref 39.0–52.0)
HEMOGLOBIN: 14.3 g/dL (ref 13.0–17.0)
Immature Granulocytes: 1 %
Lymphocytes Relative: 6 %
Lymphs Abs: 0.8 10*3/uL (ref 0.7–4.0)
MCH: 31.2 pg (ref 26.0–34.0)
MCHC: 31.8 g/dL (ref 30.0–36.0)
MCV: 98 fL (ref 80.0–100.0)
Monocytes Absolute: 0.9 10*3/uL (ref 0.1–1.0)
Monocytes Relative: 7 %
Neutro Abs: 11.6 10*3/uL — ABNORMAL HIGH (ref 1.7–7.7)
Neutrophils Relative %: 86 %
Platelets: 269 10*3/uL (ref 150–400)
RBC: 4.58 MIL/uL (ref 4.22–5.81)
RDW: 13.2 % (ref 11.5–15.5)
WBC: 13.4 10*3/uL — ABNORMAL HIGH (ref 4.0–10.5)
nRBC: 0 % (ref 0.0–0.2)

## 2018-06-05 LAB — COMPREHENSIVE METABOLIC PANEL
ALT: 60 U/L — ABNORMAL HIGH (ref 0–44)
AST: 38 U/L (ref 15–41)
Albumin: 3.1 g/dL — ABNORMAL LOW (ref 3.5–5.0)
Alkaline Phosphatase: 176 U/L — ABNORMAL HIGH (ref 38–126)
Anion gap: 8 (ref 5–15)
BUN: 18 mg/dL (ref 6–20)
CHLORIDE: 97 mmol/L — AB (ref 98–111)
CO2: 27 mmol/L (ref 22–32)
Calcium: 8.4 mg/dL — ABNORMAL LOW (ref 8.9–10.3)
Creatinine, Ser: 0.83 mg/dL (ref 0.61–1.24)
GFR calc Af Amer: >60 mL/min (ref >60)
GFR calc non Af Amer: >60 mL/min (ref >60)
Glucose, Bld: 99 mg/dL (ref 70–99)
Potassium: 3.8 mmol/L (ref 3.5–5.1)
Sodium: 132 mmol/L — ABNORMAL LOW (ref 135–145)
Total Bilirubin: 0.5 mg/dL (ref 0.3–1.2)
Total Protein: 7.2 g/dL (ref 6.5–8.1)

## 2018-06-05 LAB — ETHANOL: Alcohol, Ethyl (B): <10 mg/dL (ref <10)

## 2018-06-05 LAB — RAPID URINE DRUG SCREEN, HOSP PERFORMED
Amphetamines: NOT DETECTED
Barbiturates: NOT DETECTED
Benzodiazepines: NOT DETECTED
Cocaine: NOT DETECTED
OPIATES: NOT DETECTED
Tetrahydrocannabinol: NOT DETECTED

## 2018-06-05 LAB — URINALYSIS, ROUTINE W REFLEX MICROSCOPIC
Bacteria, UA: NONE SEEN
Bilirubin Urine: NEGATIVE
Glucose, UA: NEGATIVE mg/dL
Hgb urine dipstick: NEGATIVE
Ketones, ur: NEGATIVE mg/dL
Nitrite: NEGATIVE
PROTEIN: NEGATIVE mg/dL
Specific Gravity, Urine: 1.021 (ref 1.005–1.030)
pH: 5 (ref 5.0–8.0)

## 2018-06-05 LAB — CK: Total CK: 62 U/L (ref 49–397)

## 2018-06-05 LAB — MAGNESIUM: Magnesium: 2.5 mg/dL — ABNORMAL HIGH (ref 1.7–2.4)

## 2018-06-05 MED ORDER — SULFAMETHOXAZOLE-TRIMETHOPRIM 800-160 MG PO TABS: 1.0000 | ORAL_TABLET | Freq: Two times a day (BID) | ORAL | 0 refills | Status: DC

## 2018-06-05 MED ORDER — CEPHALEXIN 500 MG PO CAPS: 500.0000 mg | ORAL_CAPSULE | Freq: Two times a day (BID) | ORAL | 0 refills | Status: AC

## 2018-06-05 MED ORDER — SODIUM CHLORIDE 0.9 % IV BOLUS
1000.0000 mL | Freq: Once | INTRAVENOUS | Status: AC
  Administered 2018-06-05: 1000 mL via INTRAVENOUS

## 2018-06-05 MED ORDER — ACETAMINOPHEN 325 MG PO TABS
650.0000 mg | ORAL_TABLET | Freq: Once | ORAL | Status: AC
  Administered 2018-06-05: 650 mg via ORAL
  Filled 2018-06-05: qty 2

## 2018-06-05 NOTE — ED Provider Notes (Signed)
[facility]-EMERGENCY DEPT Provider Note   CSN: [id number] Arrival date & time: 06/05/18  1313    History   Chief Complaint Chief Complaint  Patient presents with   Withdrawal    HPI Dennis Tyler is a 48 y.o. male.     48 y.o male with a PMH of HTN (non-complaint) polysubstance abuse along with heroin abuse presents to the ED with complaints of withdrawals x 2 weeks. Patient last snorted heroin on 05/26/2018, since then he has experienced nausea, feeling fatigue, multiple episodes of diarrhea. Mother at the bedside reports she was concern since patient was febrile last night with a Tmax of 103, she reports giving him some tylenol with improvement. She also states patient had chills and cold sweats throughout the night. She reports patient has been eating less, reports his last meal was yesterday morning which consisted of two pieces of toast. According to records which I have reviewed patient has a history of anoxic brain injury which according to mother he has short term memory loss. She reports there has been no changes to his memory. He denies any abdominal pain, urinary symptoms, chest pain. He denies any alcohol use, seizure like activity or DTs. Mother denies any traveling history, patient has not left home in two weeks.      History reviewed. No pertinent past medical history.  Patient Active Problem List   Diagnosis Date Noted   Pressure injury of skin 05/28/2018   Metabolic encephalopathy 05/27/2018   Acute encephalopathy 05/26/2018   Rhabdomyolysis 05/26/2018   Lactic acidosis 05/26/2018   Hypokalemia 05/26/2018   Leukocytosis 05/26/2018    Past Surgical History:  Procedure Laterality Date   APPENDECTOMY     KNEE SURGERY          Home Medications    Prior to Admission medications   Medication Sig Start Date End Date Taking? Authorizing Provider  acetaminophen (TYLENOL) 500 MG tablet Take 500-1,000 mg by mouth daily as needed  for moderate pain or fever.    Yes [provider]  amLODipine (NORVASC) 10 MG tablet Take 10 mg by mouth daily.   Yes [provider]  folic acid (FOLVITE) 1 MG tablet Take 1 tablet (1 mg total) by mouth daily. 05/30/18  Yes Pokhrel, Laxman, MD  labetalol (NORMODYNE) 200 MG tablet Take 200 mg by mouth 2 (two) times daily. 04/14/18  Yes [provider]  magnesium oxide (MAG-OX) 400 (241.3 Mg) MG tablet Take 1 tablet (400 mg total) by mouth 2 (two) times daily. 05/29/18  Yes Pokhrel, Laxman, MD  thiamine 100 MG tablet Take 1 tablet (100 mg total) by mouth daily. 05/30/18  Yes Pokhrel, Laxman, MD  aspirin-sod bicarb-citric acid (ALKA-SELTZER) 325 MG TBEF tablet Take 325 mg by mouth daily as needed (indigestion).    [provider]  cephALEXin (KEFLEX) 500 MG capsule Take 1 capsule (500 mg total) by mouth 2 (two) times daily for 7 days. 06/05/18 06/12/18  Claude Manges, PA-C  magnesium oxide (MAG-OX) 400 MG tablet Take 1 tablet by mouth 2 (two) times daily. 05/29/18   [provider]  methylphenidate (RITALIN) 10 MG tablet Take 2 tablets (20 mg total) by mouth 2 (two) times daily. Patient not taking: Reported on 06/05/2018 05/29/18   Pokhrel, Rebekah Chesterfield, MD  permethrin (ELIMITE) 5 % cream Apply to affected area once, leaving on for 10 hours, then shower. Patient not taking: Reported on 05/26/2018 11/11/14   Burgess Amor, PA-C  Zinc Oxide (DESITIN) 13 % CREA  Apply 1 application topically daily as needed (bed sore).    [provider]    Family History Family History  Problem Relation Age of Onset   Cancer Other    Stroke Other     Social History Social History   Tobacco Use   Smoking status: Current Every Day Smoker    Packs/day: 1.00    Years: 20.00    Pack years: 20.00    Types: Cigarettes   Smokeless tobacco: Never Used  Substance Use Topics   Alcohol use: Not Currently    Alcohol/week: 84.0 standard drinks    Types: 84 Cans of beer per  week    Comment: since 05/26/18   Drug use: Not Currently    Types: Marijuana, IV    Comment: last use on 05/26/18     Allergies   Patient has no known allergies.   Review of Systems Review of Systems  Constitutional: Positive for fever. Negative for chills.  HENT: Negative for ear pain and sore throat.   Eyes: Negative for pain and visual disturbance.  Respiratory: Negative for cough and shortness of breath.   Cardiovascular: Negative for chest pain and palpitations.  Gastrointestinal: Positive for diarrhea and nausea. Negative for abdominal pain and vomiting.  Genitourinary: Negative for dysuria and hematuria.  Musculoskeletal: Positive for myalgias. Negative for arthralgias and back pain.  Skin: Negative for color change and rash.  Neurological: Negative for seizures and syncope.  All other systems reviewed and are negative.    Physical Exam Updated Vital Signs BP 119/69    Pulse 81    Temp 97.9 F (36.6 C) (Oral)    Resp 18    Ht 5\' 7"  (1.702 m)    Wt 86.3 kg    SpO2 98%    BMI 29.80 kg/m   Physical Exam Vitals signs and nursing note reviewed.  Constitutional:      Appearance: He is well-developed.  HENT:     Head: Normocephalic and atraumatic.  Eyes:     General: No scleral icterus.    Pupils: Pupils are equal, round, and reactive to light.  Neck:     Musculoskeletal: Normal range of motion.  Cardiovascular:     Heart sounds: Normal heart sounds.  Pulmonary:     Effort: Pulmonary effort is normal.     Breath sounds: Normal breath sounds. No wheezing.  Chest:     Chest wall: No tenderness.  Abdominal:     General: Bowel sounds are normal. There is no distension.     Palpations: Abdomen is soft.     Tenderness: There is no abdominal tenderness.  Musculoskeletal:        General: No tenderness or deformity.       Back:  Skin:    General: Skin is warm and dry.  Neurological:     Mental Status: He is alert and oriented to person, place, and time. Mental  status is at baseline.      ED Treatments / Results  Labs (all labs ordered are listed, but only abnormal results are displayed) Labs Reviewed  CBC WITH DIFFERENTIAL/PLATELET - Abnormal; Notable for the following components:      Result Value   WBC 13.4 (*)    Neutro Abs 11.6 (*)    Abs Immature Granulocytes 0.08 (*)    All other components within normal limits  COMPREHENSIVE METABOLIC PANEL - Abnormal; Notable for the following components:   Sodium 132 (*)    Chloride 97 (*)  Calcium 8.4 (*)    Albumin 3.1 (*)    ALT 60 (*)    Alkaline Phosphatase 176 (*)    All other components within normal limits  URINALYSIS, ROUTINE W REFLEX MICROSCOPIC - Abnormal; Notable for the following components:   APPearance HAZY (*)    Leukocytes,Ua SMALL (*)    All other components within normal limits  MAGNESIUM - Abnormal; Notable for the following components:   Magnesium 2.5 (*)    All other components within normal limits  URINE CULTURE  ETHANOL  CK  RAPID URINE DRUG SCREEN, HOSP PERFORMED    EKG EKG Interpretation  Date/Time:  Saturday June 05 2018 15:03:32 EDT Ventricular Rate:  71 PR Interval:    QRS Duration: 102 QT Interval:  386 QTC Calculation: 420 R Axis:   76 Text Interpretation:  Sinus rhythm Confirmed by Vanetta Mulders [phone]) on 06/05/2018 3:08:06 PM   Radiology No results found.  Procedures Procedures (including critical care time)  Medications Ordered in ED Medications  sodium chloride 0.9 % bolus 1,000 mL (0 mLs Intravenous Stopped 06/05/18 1457)     Initial Impression / Assessment and Plan / ED Course  I have reviewed the triage vital signs and the nursing notes.  Pertinent labs & imaging results that were available during my care of the patient were reviewed by me and considered in my medical decision making (see chart for details).    Patient with a previous history of polysubstance abuse, including heroin.  Patient reports his last heroin use  was 2 weeks ago, he has been staying with mother.  Per mother he she has patient on house arrest.  He has been experiencing mildly aches, anorexia, reports having a fever last night up to 103 which was resolved with antipyretics .  Patient denies any tremors, seizure-like activity.  He also reports not using any alcohol since 2 weeks ago. CBC shows slight leukocytosis 13.4, hemoglobin is within normal limits.  CMP showed slight decrease in sodium but stable, chloride slightly decreased as well.  ALT is slightly elevated at 60.  CK level was remarkable, no signs or symptoms of rhabdo.  At the level was less than 10.  Magnesium slightly increased at 2.5.  Patient has been provided with 1 L bolus, suspect he is likely withdrawing from heroin but has mild symptoms according to his lab work, vitals, history provided by mother. Urinalysis showed small leukocytes, WBC 21-50, no squamous present. He reports dysuria during visit, will provide him with antibiotics to treat his infection at this time.  Spoke to mother about source of infection being urinary tract infection versus, small skin infection.  She has been treating his skin infection with topical antibiotic cream, low suspicion for this being a concern.  Will provide him with antibiotics at this time.  Strict return precautions provided.  Final Clinical Impressions(s) / ED Diagnoses   Final diagnoses:  Opiate withdrawal (HCC)  Acute cystitis without hematuria    ED Discharge Orders         Ordered    sulfamethoxazole-trimethoprim (BACTRIM DS,SEPTRA DS) 800-160 MG tablet  2 times daily,   Status:  Discontinued     06/05/18 1707    cephALEXin (KEFLEX) 500 MG capsule  2 times daily     06/05/18 1722           Claude Manges, PA-C 06/05/18 1725    Vanetta Mulders, MD 06/09/18 2051

## 2018-06-05 NOTE — ED Notes (Signed)
Discharge paperwork reviewed with pt and mother.  Pt verbalized understanding to pick up prescription and to follow up with [facility].  Pt ambulatory at discharge

## 2018-06-05 NOTE — ED Notes (Signed)
Pt ambulated to and from bathroom, no complaints

## 2018-06-05 NOTE — ED Triage Notes (Signed)
Pt states that he is having heroin withdrawals. Pt states that he has been running a fever at night since d/c.  Pt endorses diarrhea and states that he hasn't been eating.

## 2018-06-05 NOTE — Discharge Instructions (Addendum)
I have prescribed antibiotics to treat your urinary tract infection.  Please take 1 tablet 2 times a day for the next 7 days.I have provided the number to the [facility], please schedule an appointment to establish primary care.

## 2018-06-09 LAB — URINE CULTURE: Culture: 10000 — AB

## 2018-06-10 ENCOUNTER — Telehealth: Payer: Self-pay

## 2018-06-10 NOTE — Telephone Encounter (Signed)
Post ED Visit - Positive Culture Follow-up: Unsuccessful Patient Follow-up  Culture assessed and recommendations reviewed by:  []  Enzo Bi, Pharm.D. []  Celedonio Miyamoto, Pharm.D., BCPS AQ-ID []  Garvin Fila, Pharm.D., BCPS []  Georgina Pillion, [address].D., BCPS []  [place], [address].D., BCPS, AAHIVP []  Estella Husk, Pharm.D., BCPS, AAHIVP []  Sherlynn Carbon, PharmD []  Pollyann Samples, PharmD, BCPS  Pharm D at [facility] Positive urine culture Symptom check  May need to return to ED []  Patient discharged without antimicrobial prescription and treatment is now indicated []  Organism is resistant to prescribed ED discharge antimicrobial []  Patient with positive blood cultures   Unable to contact patient after 3 attempts, letter will be sent to address on file  Jerry Caras 06/10/2018, 12:58 PM

## 2018-06-11 NOTE — Progress Notes (Signed)
ED Antimicrobial Stewardship Positive Culture Follow Up  (for report ran on 3/26 and discussion with ED provider)  Dennis Tyler is an 48 y.o. male who presented to [facility] on 06/05/2018 with a chief complaint of urinary retention.  Chief Complaint  Patient presents with  . Withdrawal    Recent Results (from the past 720 hour(s))  Culture, Urine     Status: None   Collection Time: 05/26/18 10:35 PM  Result Value Ref Range Status   Specimen Description   Final    URINE, CLEAN CATCH Performed at [facility], [address] [place], [place] [address]    Special Requests   Final    NONE Performed at [facility], [address], [place], [place] [address]    Culture   Final    NO GROWTH Performed at [facility], [address], [place], [place] [address]    Report Status 05/28/2018 FINAL  Final  Urine culture     Status: Abnormal   Collection Time: 06/05/18  4:08 PM  Result Value Ref Range Status   Specimen Description   Final    URINE, CLEAN CATCH Performed at [facility], [address], [place], [place] [address]    Special Requests   Final    NONE Performed at [facility], [address] [place], [place] [address]    Culture (A)  Final    10,000 COLONIES/mL METHICILLIN RESISTANT STAPHYLOCOCCUS AUREUS   Report Status 06/09/2018 FINAL  Final   Organism ID, Bacteria METHICILLIN RESISTANT STAPHYLOCOCCUS AUREUS (A)  Final      Susceptibility   Methicillin resistant staphylococcus aureus - MIC*    CIPROFLOXACIN >=8 RESISTANT Resistant     GENTAMICIN <=0.5 SENSITIVE Sensitive     NITROFURANTOIN <=16 SENSITIVE Sensitive     OXACILLIN >=4 RESISTANT Resistant     TETRACYCLINE <=1 SENSITIVE Sensitive     VANCOMYCIN <=0.5 SENSITIVE Sensitive     TRIMETH/SULFA <=10 SENSITIVE Sensitive     CLINDAMYCIN <=0.25 SENSITIVE Sensitive     RIFAMPIN <=0.5 SENSITIVE Sensitive     Inducible  Clindamycin NEGATIVE Sensitive     * 10,000 COLONIES/mL METHICILLIN RESISTANT STAPHYLOCOCCUS AUREUS    [x]  Treated with keflex, organism resistant to prescribed antimicrobial []  Patient discharged originally without antimicrobial agent and treatment is now indicated  Plan: 1) ask patient if he has symptoms (symptoms for urinary and systemic infections) 2) If symptomatic, come back to the ED for eval  ED Provider: Leonia Corona, PA   Dorna Leitz P 06/11/2018, 8:35 AM Clinical Pharmacist [phone]

## 2018-10-22 DIAGNOSIS — R079 Chest pain, unspecified: Secondary | ICD-10-CM | POA: Diagnosis not present

## 2018-10-22 DIAGNOSIS — J9811 Atelectasis: Secondary | ICD-10-CM | POA: Diagnosis not present

## 2018-10-22 DIAGNOSIS — R000 Tachycardia, unspecified: Secondary | ICD-10-CM | POA: Diagnosis not present

## 2018-10-22 DIAGNOSIS — J690 Pneumonitis due to inhalation of food and vomit: Secondary | ICD-10-CM | POA: Diagnosis not present

## 2018-10-22 DIAGNOSIS — R0602 Shortness of breath: Secondary | ICD-10-CM | POA: Diagnosis not present

## 2018-10-22 DIAGNOSIS — Z720 Tobacco use: Secondary | ICD-10-CM | POA: Diagnosis not present

## 2018-10-22 DIAGNOSIS — R0902 Hypoxemia: Secondary | ICD-10-CM | POA: Diagnosis not present

## 2018-10-22 DIAGNOSIS — I2699 Other pulmonary embolism without acute cor pulmonale: Secondary | ICD-10-CM | POA: Diagnosis not present

## 2018-10-25 ENCOUNTER — Telehealth: Payer: Self-pay | Admitting: General Practice

## 2018-10-25 NOTE — Telephone Encounter (Signed)
It seems he is noncompliant with follow up and substance abuse programs. I will not see him for any controlled substances and if this is what he needs to help with substance abuse, he should see a specialist in addiction medication. If he just needs a general PCP, I will be glad to help. No controlled substances. If he does not keep appointments and does not comply with treatment regimens, he will be discharged from my services.

## 2018-10-25 NOTE — Telephone Encounter (Signed)
lmtcb

## 2018-10-25 NOTE — Telephone Encounter (Signed)
Mom aware and verbalizes understanding.  States that she wants his PCP to be here and will call [facility] to set up an appointment for his addiction.  Apt scheduled with you to est care- FYI

## 2018-10-25 NOTE — Telephone Encounter (Signed)
Please review chart - would you be willing to take him on as a new patient ? Please advise

## 2018-10-27 DIAGNOSIS — I517 Cardiomegaly: Secondary | ICD-10-CM | POA: Diagnosis not present

## 2018-10-27 DIAGNOSIS — Z79899 Other long term (current) drug therapy: Secondary | ICD-10-CM | POA: Diagnosis not present

## 2018-10-27 DIAGNOSIS — Z0000 Encounter for general adult medical examination without abnormal findings: Secondary | ICD-10-CM | POA: Diagnosis not present

## 2018-10-27 DIAGNOSIS — I10 Essential (primary) hypertension: Secondary | ICD-10-CM | POA: Diagnosis not present

## 2018-10-27 DIAGNOSIS — Z87898 Personal history of other specified conditions: Secondary | ICD-10-CM | POA: Diagnosis not present

## 2018-10-27 DIAGNOSIS — R5383 Other fatigue: Secondary | ICD-10-CM | POA: Diagnosis not present

## 2018-10-31 DIAGNOSIS — I10 Essential (primary) hypertension: Secondary | ICD-10-CM | POA: Diagnosis not present

## 2018-10-31 DIAGNOSIS — Z1159 Encounter for screening for other viral diseases: Secondary | ICD-10-CM | POA: Diagnosis not present

## 2018-10-31 DIAGNOSIS — R768 Other specified abnormal immunological findings in serum: Secondary | ICD-10-CM | POA: Diagnosis not present

## 2018-10-31 DIAGNOSIS — Z79899 Other long term (current) drug therapy: Secondary | ICD-10-CM | POA: Diagnosis not present

## 2018-11-10 ENCOUNTER — Ambulatory Visit: Payer: Self-pay | Admitting: Family Medicine

## 2018-11-11 DIAGNOSIS — Z79899 Other long term (current) drug therapy: Secondary | ICD-10-CM | POA: Diagnosis not present

## 2018-11-28 ENCOUNTER — Encounter (HOSPITAL_COMMUNITY): Payer: Self-pay | Admitting: Emergency Medicine

## 2018-11-28 ENCOUNTER — Other Ambulatory Visit: Payer: Self-pay

## 2018-11-28 ENCOUNTER — Emergency Department (HOSPITAL_COMMUNITY)
Admission: EM | Admit: 2018-11-28 | Discharge: 2018-11-29 | Disposition: A | Discharge status: Home / Self Care | Payer: Medicare Other | Attending: Emergency Medicine | Admitting: Emergency Medicine

## 2018-11-28 DIAGNOSIS — Z23 Encounter for immunization: Secondary | ICD-10-CM | POA: Insufficient documentation

## 2018-11-28 DIAGNOSIS — Y998 Other external cause status: Secondary | ICD-10-CM | POA: Diagnosis not present

## 2018-11-28 DIAGNOSIS — Y93G1 Activity, food preparation and clean up: Secondary | ICD-10-CM | POA: Insufficient documentation

## 2018-11-28 DIAGNOSIS — L089 Local infection of the skin and subcutaneous tissue, unspecified: Secondary | ICD-10-CM | POA: Insufficient documentation

## 2018-11-28 DIAGNOSIS — T23002A Burn of unspecified degree of left hand, unspecified site, initial encounter: Secondary | ICD-10-CM | POA: Insufficient documentation

## 2018-11-28 DIAGNOSIS — F17210 Nicotine dependence, cigarettes, uncomplicated: Secondary | ICD-10-CM | POA: Insufficient documentation

## 2018-11-28 DIAGNOSIS — F1910 Other psychoactive substance abuse, uncomplicated: Secondary | ICD-10-CM | POA: Insufficient documentation

## 2018-11-28 DIAGNOSIS — Y92010 Kitchen of single-family (private) house as the place of occurrence of the external cause: Secondary | ICD-10-CM | POA: Insufficient documentation

## 2018-11-28 DIAGNOSIS — Z79899 Other long term (current) drug therapy: Secondary | ICD-10-CM | POA: Insufficient documentation

## 2018-11-28 DIAGNOSIS — X102XXA Contact with fats and cooking oils, initial encounter: Secondary | ICD-10-CM | POA: Diagnosis not present

## 2018-11-28 DIAGNOSIS — F19959 Other psychoactive substance use, unspecified with psychoactive substance-induced psychotic disorder, unspecified: Secondary | ICD-10-CM | POA: Insufficient documentation

## 2018-11-28 DIAGNOSIS — F1210 Cannabis abuse, uncomplicated: Secondary | ICD-10-CM | POA: Insufficient documentation

## 2018-11-28 DIAGNOSIS — T300 Burn of unspecified body region, unspecified degree: Secondary | ICD-10-CM

## 2018-11-28 NOTE — ED Triage Notes (Signed)
Pt states he fell and injured L hand 1 month ago.  States he had cast removed 1 week ago.  C/o pain and sores to L hand.  Pt currently squeezing sores and picking at them.  Encouraged not to mess with them.

## 2018-11-29 LAB — COMPREHENSIVE METABOLIC PANEL
ALT: 17 U/L (ref 0–44)
AST: 35 U/L (ref 15–41)
Albumin: 4.1 g/dL (ref 3.5–5.0)
Alkaline Phosphatase: 106 U/L (ref 38–126)
Anion gap: 10 (ref 5–15)
BUN: 17 mg/dL (ref 6–20)
CO2: 23 mmol/L (ref 22–32)
Calcium: 9 mg/dL (ref 8.9–10.3)
Chloride: 106 mmol/L (ref 98–111)
Creatinine, Ser: 1.13 mg/dL (ref 0.61–1.24)
GFR calc Af Amer: >60 mL/min (ref >60)
GFR calc non Af Amer: >60 mL/min (ref >60)
Glucose, Bld: 87 mg/dL (ref 70–99)
Potassium: 3.3 mmol/L — ABNORMAL LOW (ref 3.5–5.1)
Sodium: 139 mmol/L (ref 135–145)
Total Bilirubin: 1 mg/dL (ref 0.3–1.2)
Total Protein: 7.2 g/dL (ref 6.5–8.1)

## 2018-11-29 LAB — CBC WITH DIFFERENTIAL/PLATELET
Abs Immature Granulocytes: 0.03 10*3/uL (ref 0.00–0.07)
Basophils Absolute: 0.1 10*3/uL (ref 0.0–0.1)
Basophils Relative: 1 %
Eosinophils Absolute: 1.4 10*3/uL — ABNORMAL HIGH (ref 0.0–0.5)
Eosinophils Relative: 14 %
HCT: 46.9 % (ref 39.0–52.0)
Hemoglobin: 15.1 g/dL (ref 13.0–17.0)
Immature Granulocytes: 0 %
Lymphocytes Relative: 34 %
Lymphs Abs: 3.6 10*3/uL (ref 0.7–4.0)
MCH: 28.8 pg (ref 26.0–34.0)
MCHC: 32.2 g/dL (ref 30.0–36.0)
MCV: 89.3 fL (ref 80.0–100.0)
Monocytes Absolute: 1 10*3/uL (ref 0.1–1.0)
Monocytes Relative: 10 %
Neutro Abs: 4.4 10*3/uL (ref 1.7–7.7)
Neutrophils Relative %: 41 %
Platelets: 290 10*3/uL (ref 150–400)
RBC: 5.25 MIL/uL (ref 4.22–5.81)
RDW: 14.8 % (ref 11.5–15.5)
WBC: 10.6 10*3/uL — ABNORMAL HIGH (ref 4.0–10.5)
nRBC: 0 % (ref 0.0–0.2)

## 2018-11-29 LAB — ETHANOL: Alcohol, Ethyl (B): <10 mg/dL (ref <10)

## 2018-11-29 LAB — ACETAMINOPHEN LEVEL: Acetaminophen (Tylenol), Serum: <10 ug/mL — ABNORMAL LOW (ref 10–30)

## 2018-11-29 LAB — SALICYLATE LEVEL: Salicylate Lvl: <7 mg/dL (ref 2.8–30.0)

## 2018-11-29 MED ORDER — CEPHALEXIN 500 MG PO CAPS: 500.0000 mg | ORAL_CAPSULE | Freq: Four times a day (QID) | ORAL | 0 refills | Status: AC

## 2018-11-29 MED ORDER — SILVER SULFADIAZINE 1 % EX CREA
TOPICAL_CREAM | Freq: Once | CUTANEOUS | Status: AC
  Administered 2018-11-29: 1 via TOPICAL
  Filled 2018-11-29: qty 85

## 2018-11-29 MED ORDER — SILVER SULFADIAZINE 1 % EX CREA: 1.0000 "application " | TOPICAL_CREAM | Freq: Every day | CUTANEOUS | 0 refills | Status: AC

## 2018-11-29 MED ORDER — TETANUS-DIPHTH-ACELL PERTUSSIS 5-2.5-18.5 LF-MCG/0.5 IM SUSP
0.5000 mL | Freq: Once | INTRAMUSCULAR | Status: AC
  Administered 2018-11-29: 0.5 mL via INTRAMUSCULAR
  Filled 2018-11-29: qty 0.5

## 2018-11-29 MED ORDER — CEPHALEXIN 250 MG PO CAPS
500.0000 mg | ORAL_CAPSULE | Freq: Once | ORAL | Status: AC
  Administered 2018-11-29: 500 mg via ORAL
  Filled 2018-11-29: qty 2

## 2018-11-29 NOTE — Discharge Instructions (Signed)
Refrain from picking at your wounds. Use silvadene to affected area twice daily.  Take keflex as prescribed, make sure to finish the whole course. Follow-up with your primary care doctor. Return here for any new/acute changes.
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
## 2018-11-29 NOTE — ED Notes (Signed)
Pt did not respond to VS check.

## 2018-11-29 NOTE — ED Notes (Signed)
TTS in process 

## 2020-06-30 IMAGING — MR MRI HEAD WITHOUT CONTRAST
8 of 10 series · 38 of 48 positions shown · non-contrast
Comparison: Head CT 05/26/2018

CLINICAL DATA: Altered mental status.  Recent heroin overdose.

EXAM:
MRI HEAD WITHOUT CONTRAST
TECHNIQUE: Multiplanar, multiecho pulse sequences of the brain and surrounding
structures were obtained without intravenous contrast.

[Series 3: DWI · axial · 3.0mm · 1.09mm/px · z∈[-51,+108]mm · 11 of 108 slices shown (1 of 4)]
[im 1/108]
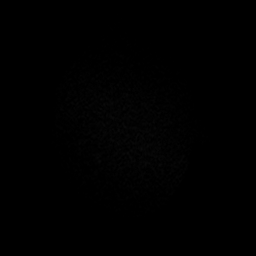
[im 11/108]
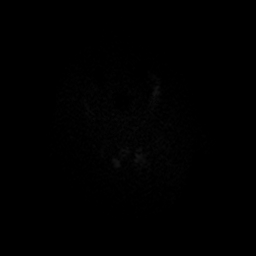
[im 22/108]
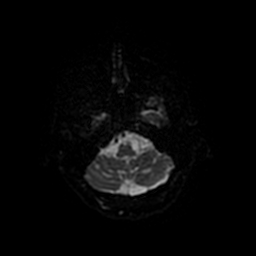
[im 33/108]
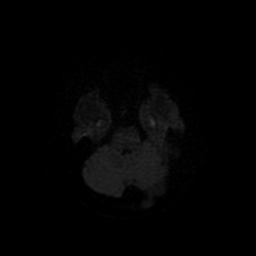
[im 43/108]
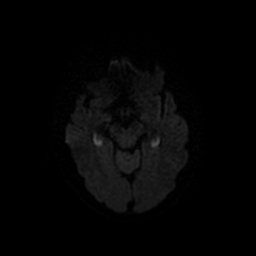
[im 54/108]
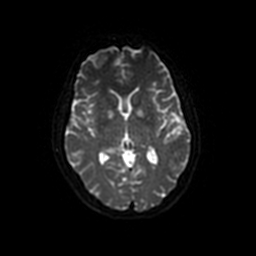
[im 65/108]
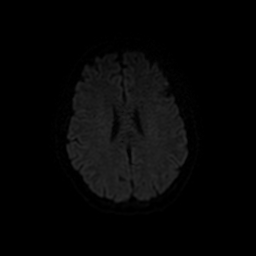
[im 75/108]
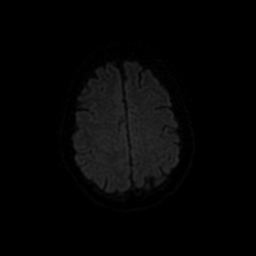
[im 86/108]
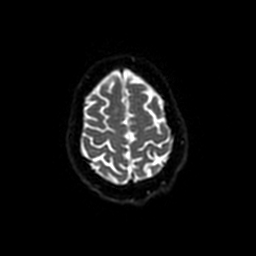
[im 97/108]
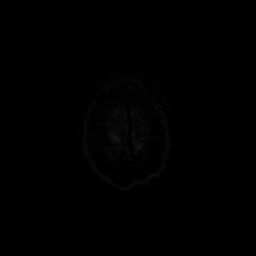
[im 108/108]
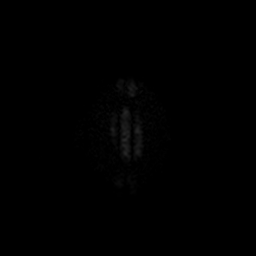

[Series 6: T1 · sagittal · 5.0mm · 0.47mm/px · 1 of 22 slices shown]
[im 1/22]
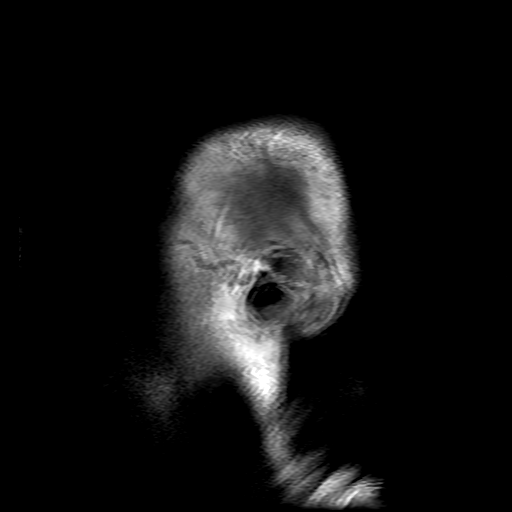

[Series 7: T2 · axial · 5.0mm · 0.43mm/px · z∈[-37,+119]mm · 3 of 27 slices shown (1 of 2)]
[im 1/27]
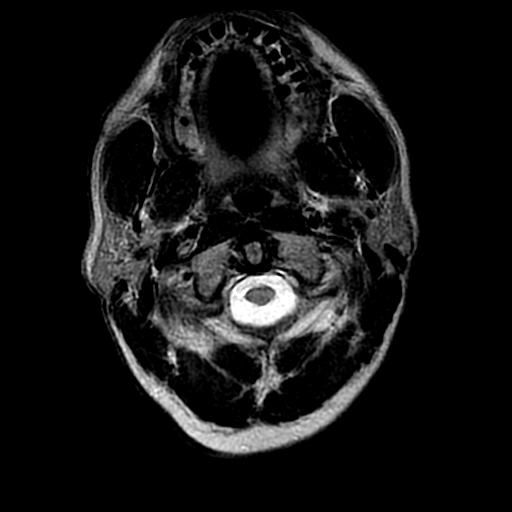
[im 14/27]
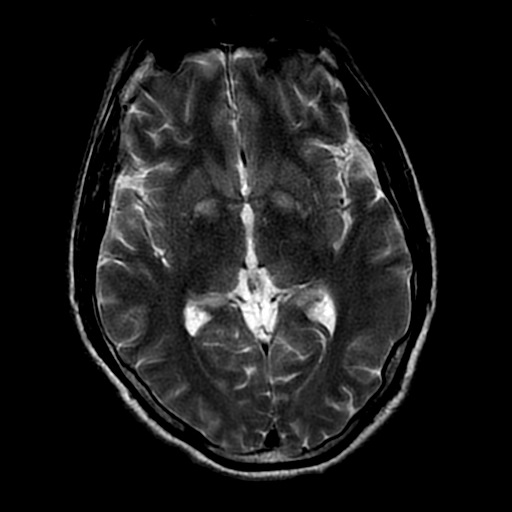
[im 27/27]
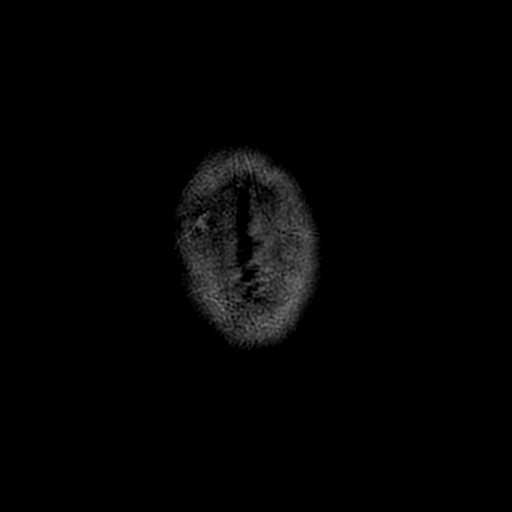

[Series 8: FLAIR · axial · 3.0mm · 0.43mm/px · z∈[-37,+119]mm · 3 of 27 slices shown]
[im 1/27]
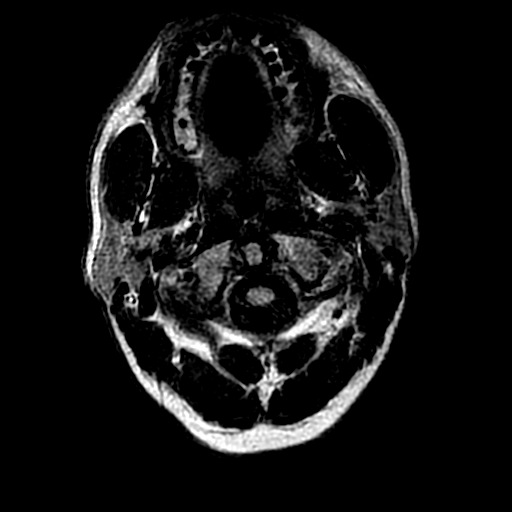
[im 14/27]
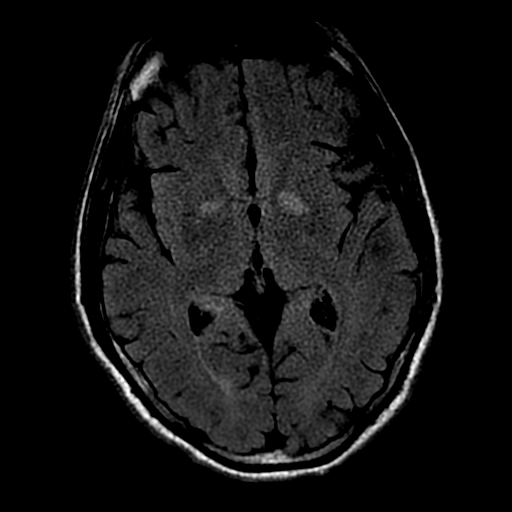
[im 27/27]
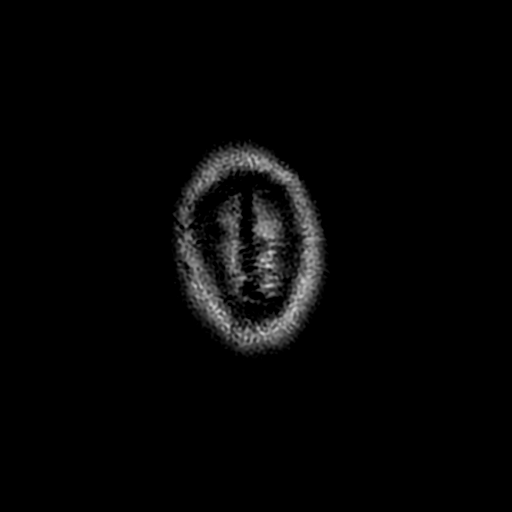

[Series 10: DWI · coronal · 5.0mm · 1.09mm/px · 8 of 76 slices shown (2 of 4)]
[im 1/76]
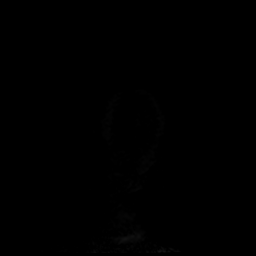
[im 11/76]
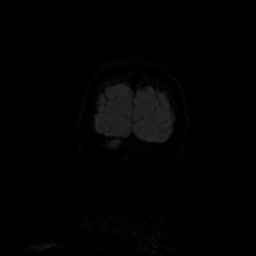
[im 22/76]
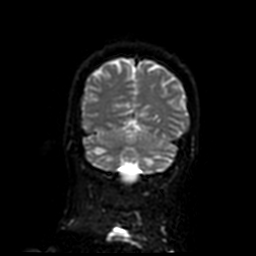
[im 33/76]
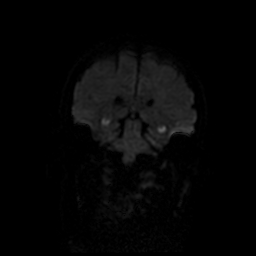
[im 43/76]
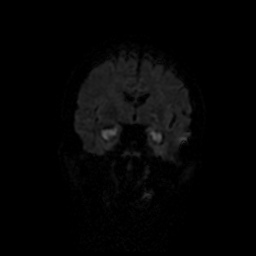
[im 54/76]
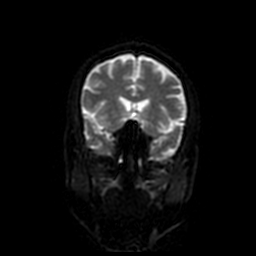
[im 65/76]
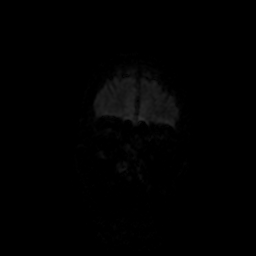
[im 76/76]
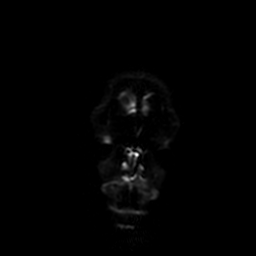

[Series 12: T2 · coronal · 5.0mm · 0.45mm/px · 3 of 30 slices shown (2 of 2)]
[im 1/30]
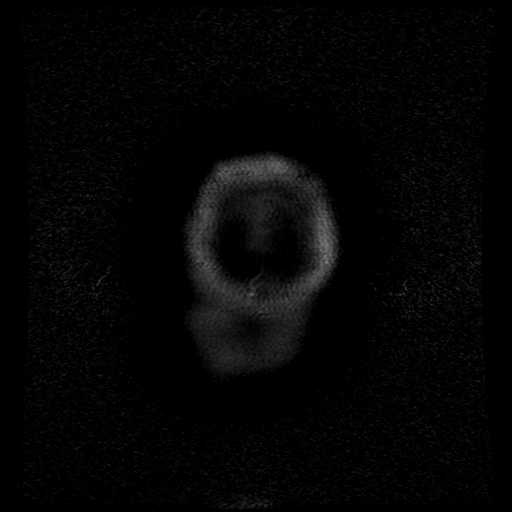
[im 15/30]
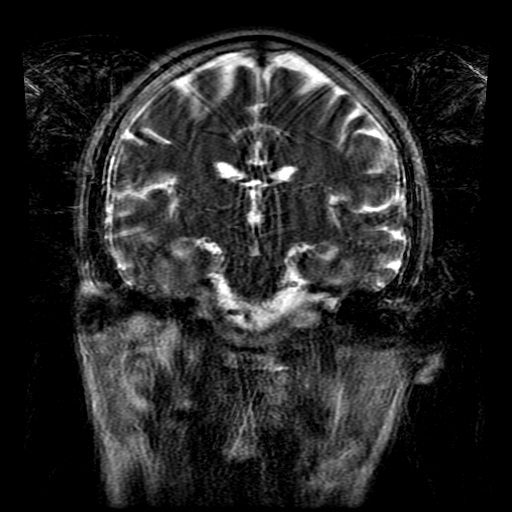
[im 30/30]
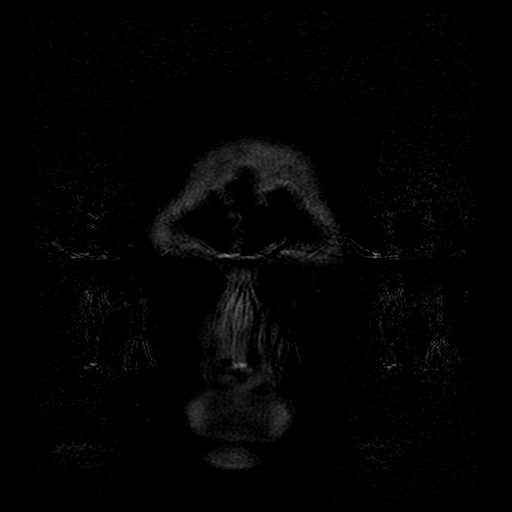

[Series 300: DWI · axial · 3.0mm · 1.09mm/px · z∈[-51,+108]mm · 5 of 53 slices shown (3 of 4)]
[im 1/53]
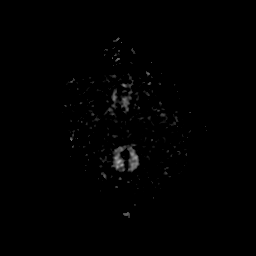
[im 14/53]
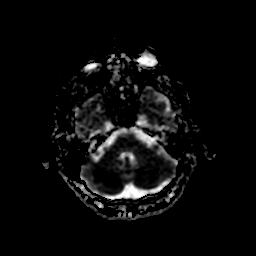
[im 27/53]
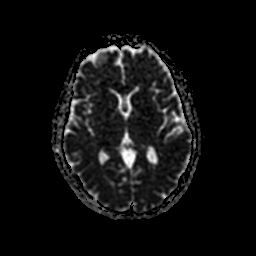
[im 40/53]
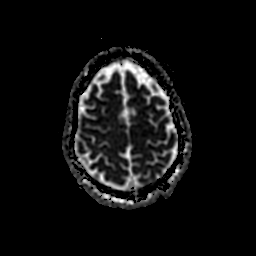
[im 53/53]
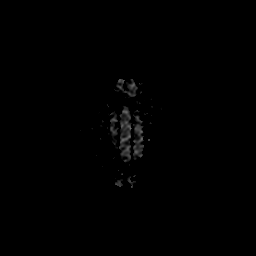

[Series 1000: DWI · coronal · 5.0mm · 1.09mm/px · 4 of 38 slices shown (4 of 4)]
[im 1/38]
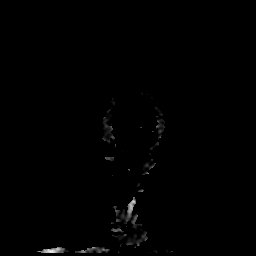
[im 13/38]
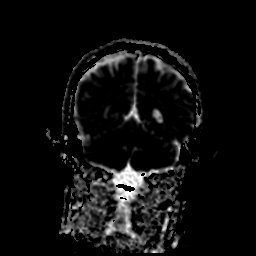
[im 25/38]
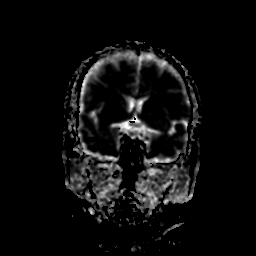
[im 38/38]
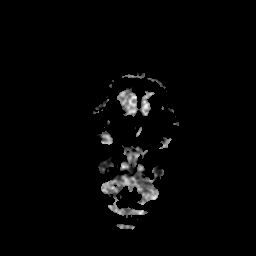

[38 of 48 positions shown; findings below may reference images not displayed]

FINDINGS: BRAIN: There is abnormal diffusion restriction within both
cerebellar hemispheres, both medial temporal lobes of both basal
ganglia, in a symmetric distribution. There is associated
hyperintense T2-weighted signal at these locations. No acute
hemorrhage or extra-axial collection. The white matter signal is
normal for the patient's age. The cerebral and cerebellar volume are
age-appropriate. Susceptibility-sensitive sequences show no chronic
microhemorrhage or superficial siderosis.

VASCULAR: Major intracranial arterial and venous sinus flow voids
are normal.

SKULL AND UPPER CERVICAL SPINE: Calvarial bone marrow signal is
normal. There is no skull base mass. Visualized upper cervical spine
and soft tissues are normal.

SINUSES/ORBITS: No fluid levels or advanced mucosal thickening. No
mastoid or middle ear effusion. The orbits are normal.
IMPRESSION: Symmetric, abnormal diffusion restriction within the medial temporal
lobes, cerebellar hemispheres and globi pallidi in a pattern typical
of heroin induced leukoencephalopathy.

## 2020-06-30 IMAGING — CT CT HEAD WITHOUT CONTRAST
3 series · 15 of 47 positions shown, 18 images · non-contrast
Comparison: CT 02/06/2014

CLINICAL DATA: Altered level of consciousness.

EXAM:
CT HEAD WITHOUT CONTRAST
TECHNIQUE: Contiguous axial images were obtained from the base of the skull
through the vertex without intravenous contrast.

[Series 2: head wo · axial · 0.43mm/px · z∈[-57,+78]mm · 9 of 33 slices shown, 12 images]
[im 3/33  brain]
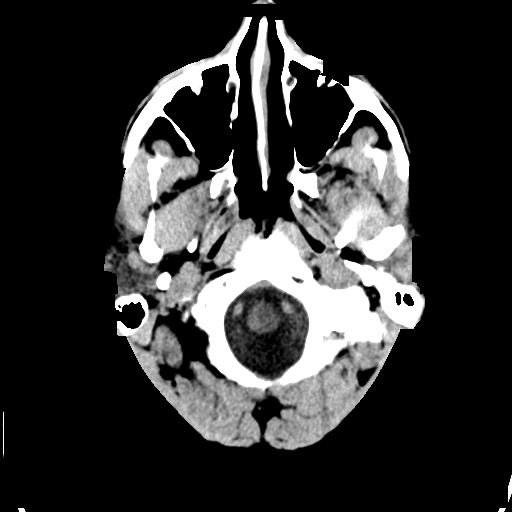
[im 3/33  bone]
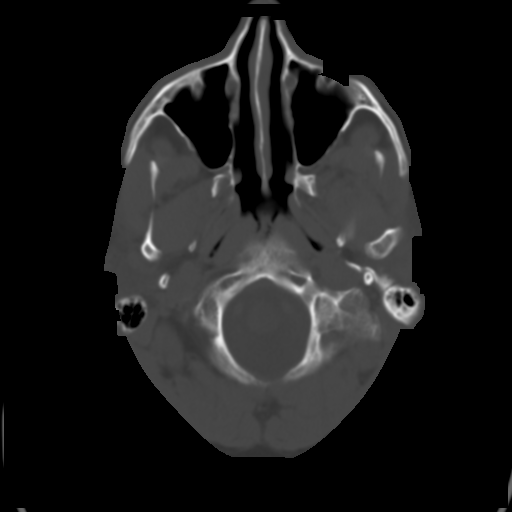
[im 6/33  brain]
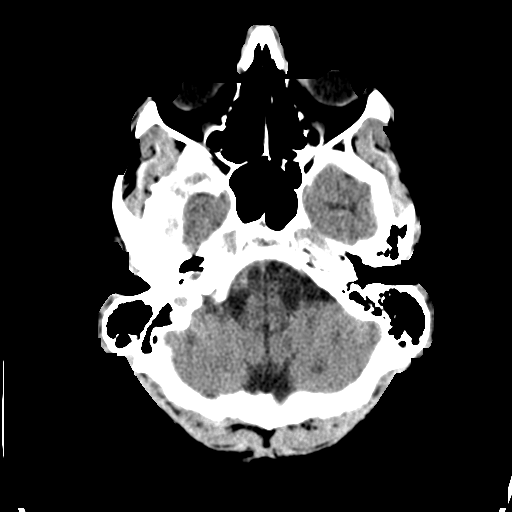
[im 9/33  brain]
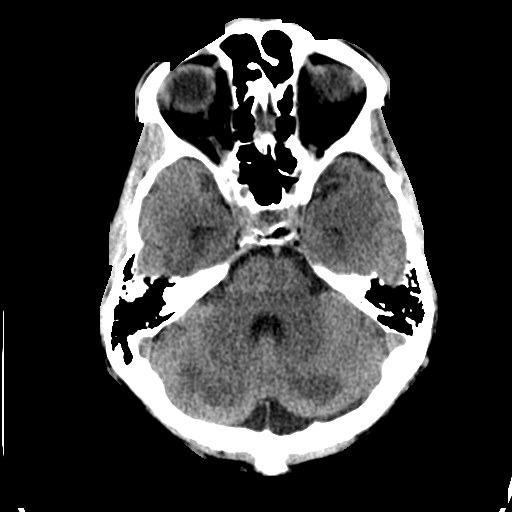
[im 13/33  brain]
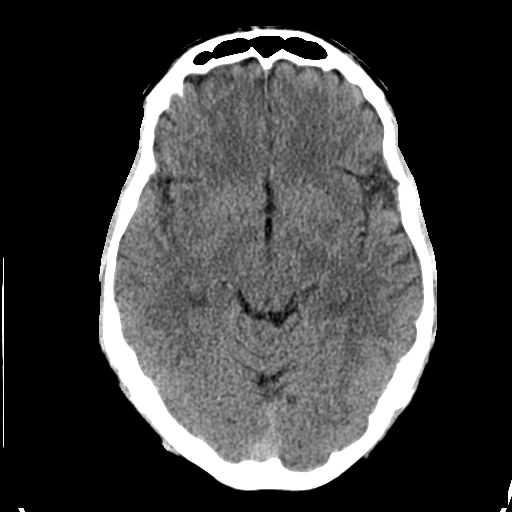
[im 17/33  brain]
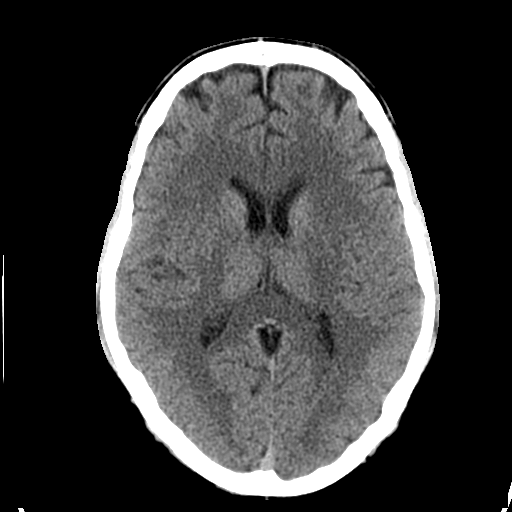
[im 17/33  bone]
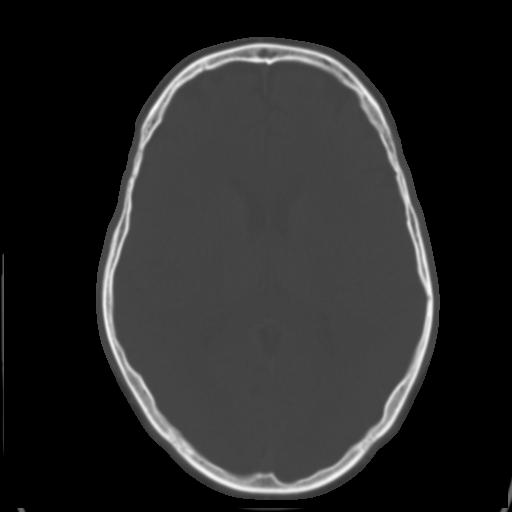
[im 20/33  brain]
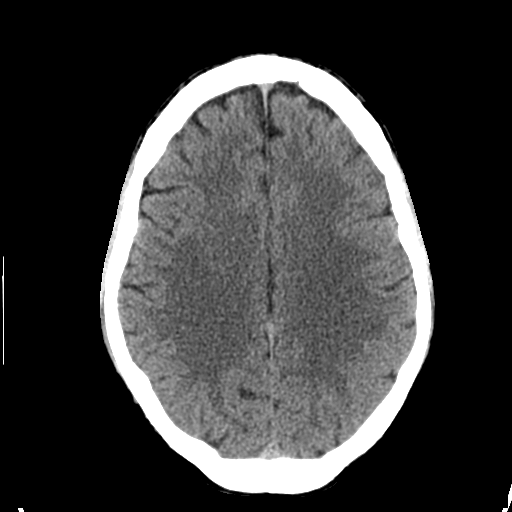
[im 24/33  brain]
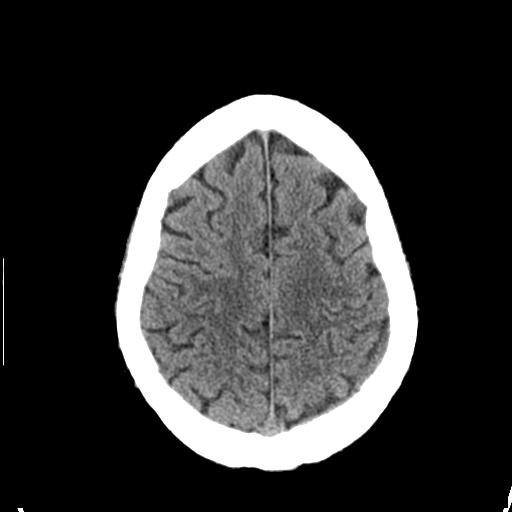
[im 27/33  brain]
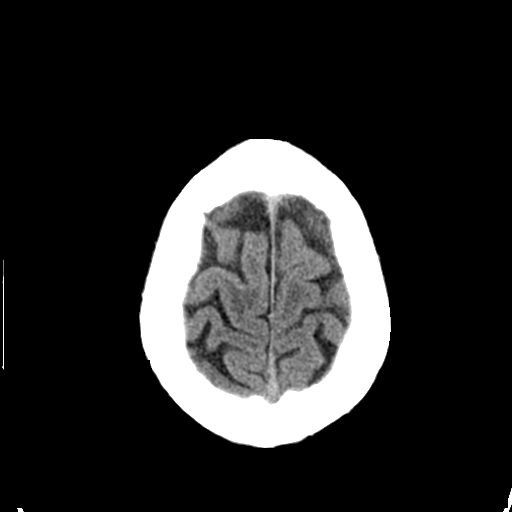
[im 30/33  brain]
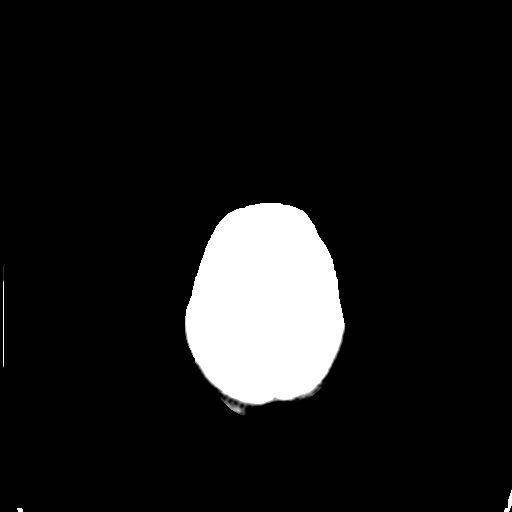
[im 30/33  bone]
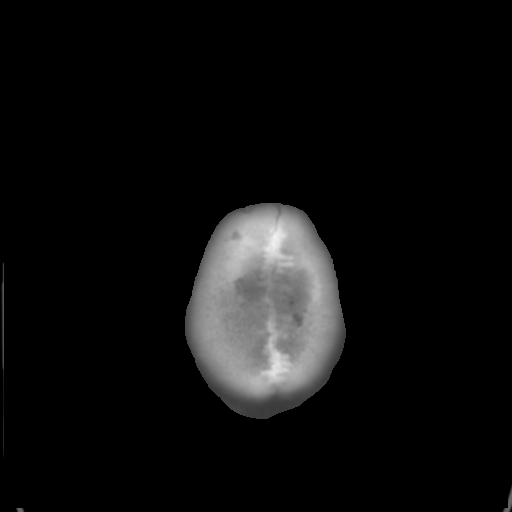

[Series 5: coronal soft tissue · coronal · 0.31mm/px · 3 of 72 slices shown]
[im 24/72  brain]
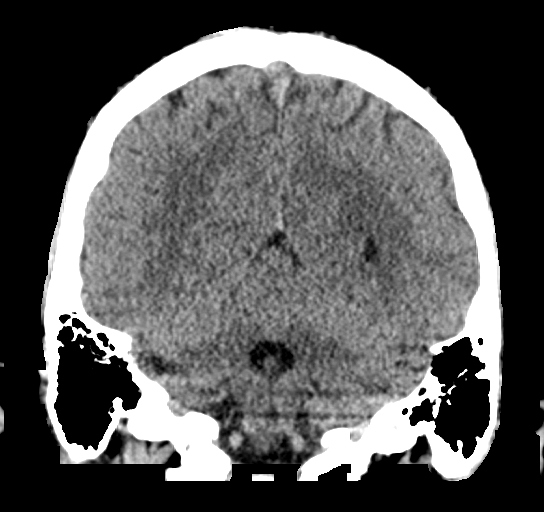
[im 32/72  brain]
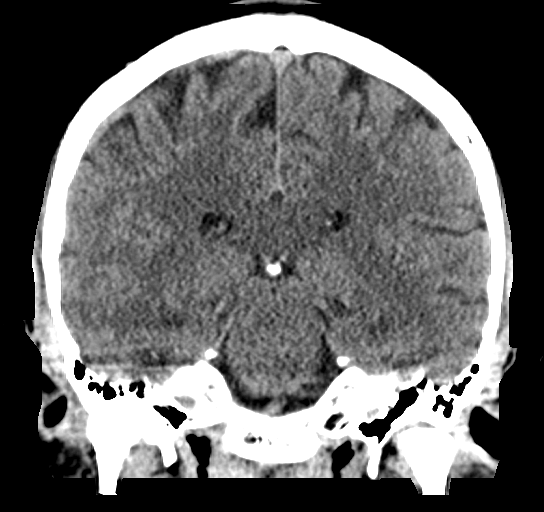
[im 40/72  brain]
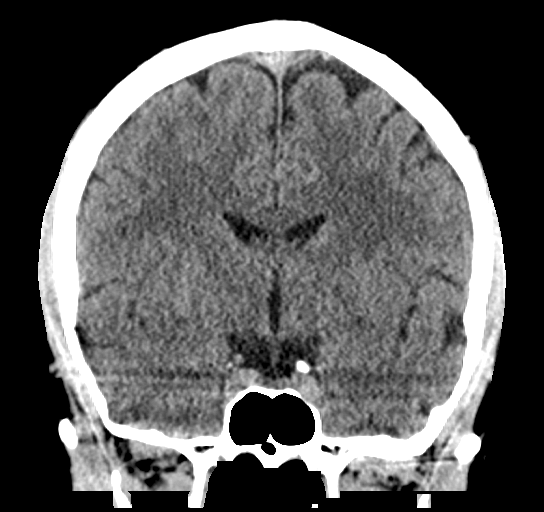

[Series 6: sagittal soft tissue · sagittal · 0.31mm/px · 3 of 58 slices shown]
[im 20/58  brain]
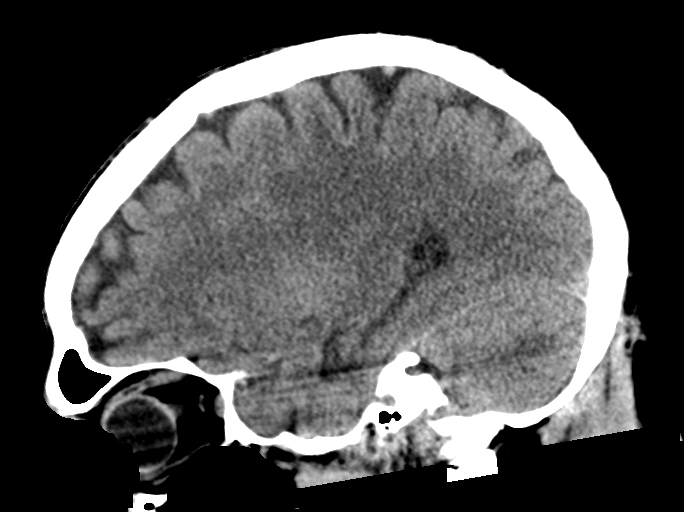
[im 29/58  brain]
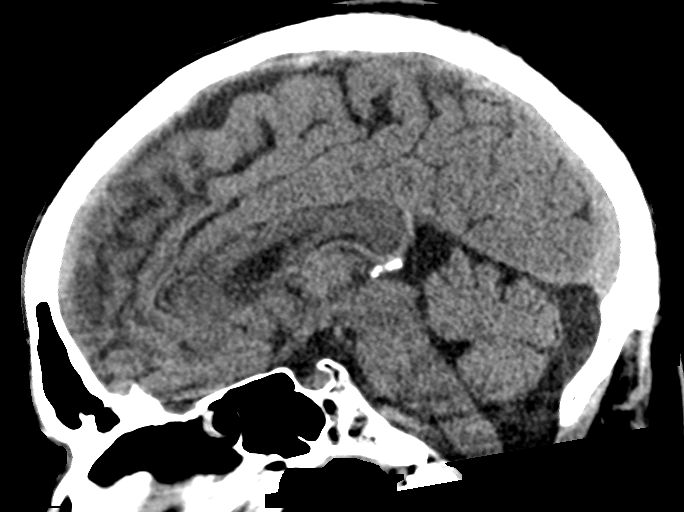
[im 39/58  brain]
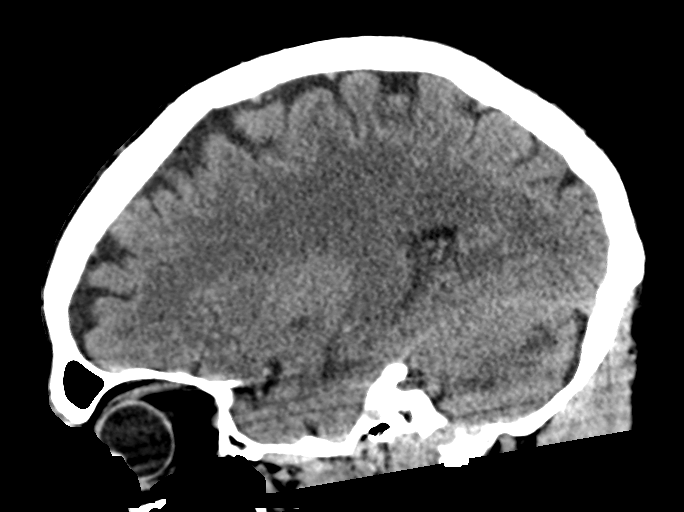

[15 of 47 positions shown; findings below may reference images not displayed]

FINDINGS: Brain: Ventricle size normal.

Symmetric hypodensities in the globus pallidus bilaterally are new
since the prior CT. Ill-defined hypodensities in the cerebellum
bilaterally also are new. These are likely due to ischemia, possibly
acute. Negative for hemorrhage or mass. No midline shift.

Vascular: Negative for hyperdense vessel

Skull: Negative

Sinuses/Orbits: Negative

Other: None
IMPRESSION: Symmetric hypodensities in the globus pallidus and cerebellum
bilaterally suggestive anoxic injury, possibly acute. Carbon
monoxide can cause this appearance by CT. The patient has history of
recent heroin use which would appear to be a more likely etiology.
MRI brain recommended to evaluate for acute infarction.

These results were called by telephone at the time of interpretation
on 05/26/2018 at [DATE] to Dr. ALIA GORELIK , who verbally
acknowledged these results.

## 2022-05-20 ENCOUNTER — Other Ambulatory Visit (HOSPITAL_COMMUNITY): Payer: Self-pay

## 2023-12-23 LAB — COLOGUARD

## 2024-03-04 ENCOUNTER — Ambulatory Visit: Payer: Self-pay

## 2024-03-04 NOTE — Telephone Encounter (Signed)
 FYI Only or Action Required?: FYI only for provider: appointment scheduled on 03/08/24.  Patient was last seen in primary care on: new patient.  Called Nurse Triage reporting Hypertension.  Symptoms began several weeks ago.  Interventions attempted: Nothing.  Symptoms are: gradually worsening.  Triage Disposition: No disposition on file.  Patient/caregiver understands and will follow disposition?:     Copied from CRM [id number]. Topic: Clinical - Red Word Triage >> Mar 04, 2024  1:00 PM Gustabo D wrote: Nurse  Lillian with [employer] says-BP levels 189/78 Pt is out of medications and bp has been high.     Reason for Disposition  [1] Systolic BP >= 200 OR Diastolic >= 120 AND [2] having NO cardiac or neurologic symptoms  Answer Assessment - Initial Assessment Questions Harriet, [facility] RN, contacted clinic with pt and pt's mom on speaker phone; RN currently with pt. Reporting BP of 189/78, asymptomatic. Pt does not currently have established PCP as he was dismissed from [facility]. Pt was seen in ED 11/08 for elevated BP 176/125. At that time pt was prescribed amlodipine 5mg  daily and hydroxizine 25mg  TID PRN for anxiety. Discussed I could not schedule pt at [facility] as he has been dismissed as a pt but I could schedule at a different office if able to get transportation. Pt's mother stated she could arrange for transport. Appt scheduled 03/08/24. Discussed d/t elevated BP, no medication and appt not being until next week, pt should seek medical attention as [facility] nurse will be leaving and not able to assist further. Pt's mother stated she had already contacted [facility] and is going to take pt there after [facility] RN visit is complete. Appointment scheduled for evaluation. Patient agrees with plan of care, and will call back if anything changes, or if symptoms worsen.      1. BLOOD PRESSURE: What is your blood pressure? Did you take at least two measurements 5 minutes apart?     Currently 189/78  per [facility] RN   2. ONSET: When did you take your blood pressure?     Upon arrival of RN   3. HOW: How did you take your blood pressure? (e.g., automatic home BP monitor, visiting nurse)     Visiting RN; pt's mother reports she does not have a way to check  4. HISTORY: Do you have a history of high blood pressure?     Yes  5. MEDICINES: Are you taking any medicines for blood pressure? Have you missed any doses recently?     Amlodipine 5mg  daily; pt is out of medication   6. OTHER SYMPTOMS: Do you have any symptoms? (e.g., blurred vision, chest pain, difficulty breathing, headache, weakness)     Anxiety sometimes; not currently. Pt's mom reports face flushes when BP elevated  Protocols used: Blood Pressure - High-A-AH

## 2024-03-08 ENCOUNTER — Ambulatory Visit: Admitting: Sports Medicine
